# Patient Record
Sex: Female | Born: 1960
Health system: Southern US, Community
[De-identification: ages and names within clinical notes are randomized; demographics above are authoritative.]

## PROBLEM LIST (undated history)

## (undated) DIAGNOSIS — F32A Depression, unspecified: Secondary | ICD-10-CM

## (undated) DIAGNOSIS — G4730 Sleep apnea, unspecified: Secondary | ICD-10-CM

## (undated) DIAGNOSIS — F329 Major depressive disorder, single episode, unspecified: Secondary | ICD-10-CM

## (undated) DIAGNOSIS — C801 Malignant (primary) neoplasm, unspecified: Secondary | ICD-10-CM

## (undated) DIAGNOSIS — R519 Headache, unspecified: Secondary | ICD-10-CM

## (undated) DIAGNOSIS — D699 Hemorrhagic condition, unspecified: Secondary | ICD-10-CM

## (undated) DIAGNOSIS — T7840XA Allergy, unspecified, initial encounter: Secondary | ICD-10-CM

## (undated) DIAGNOSIS — E785 Hyperlipidemia, unspecified: Secondary | ICD-10-CM

## (undated) DIAGNOSIS — I499 Cardiac arrhythmia, unspecified: Secondary | ICD-10-CM

## (undated) DIAGNOSIS — R011 Cardiac murmur, unspecified: Secondary | ICD-10-CM

## (undated) DIAGNOSIS — M1990 Unspecified osteoarthritis, unspecified site: Secondary | ICD-10-CM

## (undated) DIAGNOSIS — M810 Age-related osteoporosis without current pathological fracture: Secondary | ICD-10-CM

## (undated) HISTORY — PX: JOINT REPLACEMENT: SHX530

## (undated) HISTORY — PX: COLONOSCOPY: SHX174

## (undated) HISTORY — DX: Age-related osteoporosis without current pathological fracture: M81.0

## (undated) HISTORY — DX: Unspecified osteoarthritis, unspecified site: M19.90

## (undated) HISTORY — DX: Cardiac murmur, unspecified: R01.1

## (undated) HISTORY — DX: Depression, unspecified: F32.A

## (undated) HISTORY — DX: Allergy, unspecified, initial encounter: T78.40XA

## (undated) HISTORY — DX: Sleep apnea, unspecified: G47.30

---
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
## 2006-02-13 HISTORY — PX: NASAL SINUS SURGERY: SHX719

## 2010-02-13 HISTORY — PX: BREAST LUMPECTOMY: SHX2

## 2010-09-14 DIAGNOSIS — C801 Malignant (primary) neoplasm, unspecified: Secondary | ICD-10-CM

## 2010-09-14 HISTORY — DX: Malignant (primary) neoplasm, unspecified: C80.1

## 2017-11-26 LAB — HM DEXA SCAN

## 2018-06-24 LAB — HM PAP SMEAR: HM Pap smear: NORMAL

## 2018-06-24 LAB — RESULTS CONSOLE HPV: CHL HPV: NEGATIVE

## 2018-07-01 ENCOUNTER — Encounter: Payer: Self-pay | Admitting: Physician Assistant

## 2018-07-01 LAB — HM MAMMOGRAPHY

## 2018-07-17 LAB — BASIC METABOLIC PANEL
BUN: 12 (ref 4–21)
Creatinine: 0.7 (ref 0.5–1.1)
Glucose: 89
Potassium: 4.1 (ref 3.4–5.3)
Sodium: 142 (ref 137–147)

## 2018-07-17 LAB — TSH: TSH: 2 (ref 0.41–5.90)

## 2018-07-17 LAB — CBC AND DIFFERENTIAL
HCT: 39 (ref 36–46)
Hemoglobin: 12.6 (ref 12.0–16.0)
Platelets: 209 (ref 150–399)
WBC: 6.1

## 2018-07-17 LAB — LIPID PANEL
Cholesterol: 187 (ref 0–200)
HDL: 69 (ref 35–70)
LDL Cholesterol: 98
Triglycerides: 102 (ref 40–160)

## 2018-07-17 LAB — HEPATIC FUNCTION PANEL
ALT: 28 (ref 7–35)
AST: 24 (ref 13–35)

## 2018-10-23 ENCOUNTER — Ambulatory Visit (INDEPENDENT_AMBULATORY_CARE_PROVIDER_SITE_OTHER): Payer: BC Managed Care – PPO | Admitting: Physician Assistant

## 2018-10-23 ENCOUNTER — Encounter: Payer: Self-pay | Admitting: Physician Assistant

## 2018-10-23 ENCOUNTER — Other Ambulatory Visit: Payer: Self-pay

## 2018-10-23 VITALS — BP 102/70 | HR 103 | Temp 96.9°F | Resp 16 | Ht 62.0 in | Wt 143.0 lb

## 2018-10-23 DIAGNOSIS — G4700 Insomnia, unspecified: Secondary | ICD-10-CM | POA: Diagnosis not present

## 2018-10-23 DIAGNOSIS — Z23 Encounter for immunization: Secondary | ICD-10-CM | POA: Diagnosis not present

## 2018-10-23 DIAGNOSIS — M810 Age-related osteoporosis without current pathological fracture: Secondary | ICD-10-CM | POA: Diagnosis not present

## 2018-10-23 DIAGNOSIS — Z9109 Other allergy status, other than to drugs and biological substances: Secondary | ICD-10-CM

## 2018-10-23 DIAGNOSIS — F329 Major depressive disorder, single episode, unspecified: Secondary | ICD-10-CM

## 2018-10-23 DIAGNOSIS — E785 Hyperlipidemia, unspecified: Secondary | ICD-10-CM | POA: Diagnosis not present

## 2018-10-23 DIAGNOSIS — F419 Anxiety disorder, unspecified: Secondary | ICD-10-CM

## 2018-10-23 DIAGNOSIS — C50911 Malignant neoplasm of unspecified site of right female breast: Secondary | ICD-10-CM

## 2018-10-23 DIAGNOSIS — Z124 Encounter for screening for malignant neoplasm of cervix: Secondary | ICD-10-CM

## 2018-10-23 DIAGNOSIS — F32A Depression, unspecified: Secondary | ICD-10-CM

## 2018-10-23 NOTE — Progress Notes (Signed)
Patient: Patricia Kemp Female    DOB: [birth year]   58 y.o.   MRN: [id number] Visit Date: 10/24/2018  Today's Provider: Trinna Post, PA-C   Chief Complaint  Patient presents with  . Establish Care   Subjective:     HPI   Patient is here to establish care. She has moved here from [place]. 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 Her father was a [place] professor but she did not learn [place] as a child. She lived there for extended periods of time as a teenager and then again during her junior year of college. 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  Hyperlipidemia: Lipitor 10 mg daily  Lipid Panel     Component Value Date/Time   CHOL 187 07/17/2018   TRIG 102 07/17/2018   HDL 69 07/17/2018   LDLCALC 98 07/17/2018    Depression: 30 mg cymbalta once daily  Allergies: sinus congestion, PND nasacort without relief.   Osteoporosis: fosamax 70 mg weekly.   Insomnia: Is cutting 10 mg tablets of ambien into quarters and taking this nightly. She is also using melatonin. She is trying to wean herself off of Ambien.    Tetanus: unsure when she had this last.  PAP: last PAP smear, this spring, Jun 24 2018 mammogram Colonoscopy: 2019 in [place], normal. Mammogram: [facility] [place] - June 2020   History of Breast Cancerr: She underwent lumpectomy and radiation 8 years ago, did not undergo chemotherapy. She would like to be referred to [facility] and oncologist.     Allergies  Allergen Reactions  . Amoxicillin Nausea Only     Current Outpatient Medications:  .  alendronate (FOSAMAX) 70 MG tablet, Take 70 mg by mouth once a week. , Disp: , Rfl:  .  atorvastatin (LIPITOR) 10 MG tablet, Take 10 mg by mouth daily. , Disp: , Rfl:  .  DULoxetine (CYMBALTA) 30 MG capsule, Take 30 mg by mouth daily. , Disp: , Rfl:  .  Triamcinolone Acetonide (NASACORT AQ NA), Place into the nose daily., Disp: , Rfl:  .  zolpidem (AMBIEN) 10 MG tablet, Take 10 mg by mouth once.  , Disp: , Rfl:   Review of Systems  HENT: Positive for congestion and sinus pressure.   All other systems reviewed and are negative.   Social History   Tobacco Use  . Smoking status: Never Smoker  . Smokeless tobacco: Never Used  Substance Use Topics  . Alcohol use: Yes    Frequency: Never      Objective:   BP 102/70 (BP Location: Left Arm, Patient Position: Sitting, Cuff Size: Large)   Pulse (!) 103   Temp (!) 96.9 F (36.1 C) (Other (Comment))   Resp 16   Ht 5\' 2"  (1.575 m)   Wt 143 lb (64.9 kg)   SpO2 96%   BMI 26.16 kg/m  Vitals:   10/23/18 1044  BP: 102/70  Pulse: (!) 103  Resp: 16  Temp: (!) 96.9 F (36.1 C)  TempSrc: Other (Comment)  SpO2: 96%  Weight: 143 lb (64.9 kg)  Height: 5\' 2"  (1.575 m)  Body mass index is 26.16 kg/m.   Physical Exam Constitutional:      Appearance: Normal appearance.  Cardiovascular:     Rate and Rhythm: Normal rate and regular rhythm.     Heart sounds: Normal heart sounds.  Pulmonary:     Effort: Pulmonary effort is normal.  Breath sounds: Normal breath sounds.  Abdominal:     General: Bowel sounds are normal.     Palpations: Abdomen is soft.  Skin:    General: Skin is warm and dry.  Neurological:     Mental Status: She is alert and oriented to person, place, and time. Mental status is at baseline.  Psychiatric:        Mood and Affect: Mood normal.        Behavior: Behavior normal.      No results found for any visits on 10/23/18.     Assessment & Plan    1. Osteoporosis, unspecified osteoporosis type, unspecified pathological fracture presence  - alendronate (FOSAMAX) 70 MG tablet; Take 1 tablet (70 mg total) by mouth once a week.  Dispense: 12 tablet; Refill: 1  2. Hyperlipidemia, unspecified hyperlipidemia type  - atorvastatin (LIPITOR) 10 MG tablet; Take 1 tablet (10 mg total) by mouth daily.  Dispense: 90 tablet; Refill: 1  3. Anxiety and depression  - DULoxetine (CYMBALTA) 30 MG capsule; Take 1  capsule (30 mg total) by mouth daily.  Dispense: 90 capsule; Refill: 1  4. Insomnia, unspecified type  Discussed weaning off Ambien slowly. Recommend continuing 2.5 mg dose nightly for another two months and then transition to every other night. Can consider starting trazadone if she needs help sleeping.   - zolpidem (AMBIEN) 5 MG tablet; Take 0.5 tablets (2.5 mg total) by mouth at bedtime as needed for sleep.  Dispense: 30 tablet; Refill: 0  5. Environmental allergies   6. Need for influenza vaccination  - Flu Vaccine QUAD 36+ mos IM  7. Malignant neoplasm of right female breast, unspecified estrogen receptor status, unspecified site of breast [facility])  - Ambulatory referral to Hematology / Oncology  8. Cervical cancer screening  - Ambulatory referral to Obstetrics / Gynecology  The entirety of the information documented in the History of Present Illness, Review of Systems and Physical Exam were personally obtained by me. Portions of this information were initially documented by April M. Sabra Heck, CMA and reviewed by me for thoroughness and accuracy.   F/u 9 months for CPE    Trinna Post, PA-C  [facility]

## 2018-10-23 NOTE — Patient Instructions (Addendum)
Xyzal > allegra > claritin > zyrtec (allergy medicines)   Allergic Rhinitis, Adult Allergic rhinitis is a reaction to allergens in the air. Allergens are tiny specks (particles) in the air that cause your body to have an allergic reaction. This condition cannot be passed from person to person (is not contagious). Allergic rhinitis cannot be cured, but it can be controlled. There are two types of allergic rhinitis:  Seasonal. This type is also called hay fever. It happens only during certain times of the year.  Perennial. This type can happen at any time of the year. What are the causes? This condition may be caused by:  Pollen from grasses, trees, and weeds.  House dust mites.  Pet dander.  Mold. What are the signs or symptoms? Symptoms of this condition include:  Sneezing.  Runny or stuffy nose (nasal congestion).  A lot of mucus in the back of the throat (postnasal drip).  Itchy nose.  Tearing of the eyes.  Trouble sleeping.  Being sleepy during day. How is this treated? There is no cure for this condition. You should avoid things that trigger your symptoms (allergens). Treatment can help to relieve symptoms. This may include:  Medicines that block allergy symptoms, such as antihistamines. These may be given as a shot, nasal spray, or pill.  Shots that are given until your body becomes less sensitive to the allergen (desensitization).  Stronger medicines, if all other treatments have not worked. Follow these instructions at home: Avoiding allergens   Find out what you are allergic to. Common allergens include smoke, dust, and pollen.  Avoid them if you can. These are some of the things that you can do to avoid allergens: ? Replace carpet with wood, tile, or vinyl flooring. Carpet can trap dander and dust. ? Clean any mold found in the home. ? Do not smoke. Do not allow smoking in your home. ? Change your heating and air conditioning filter at least once a month.  ? During allergy season:  Keep windows closed as much as you can. If possible, use air conditioning when there is a lot of pollen in the air.  Use a special filter for allergies with your furnace and air conditioner.  Plan outdoor activities when pollen counts are lowest. This is usually during the early morning or evening hours.  If you do go outdoors when pollen count is high, wear a special mask for people with allergies.  When you come indoors, take a shower and change your clothes before sitting on furniture or bedding. General instructions  Do not use fans in your home.  Do not hang clothes outside to dry.  Wear sunglasses to keep pollen out of your eyes.  Wash your hands right away after you touch household pets.  Take over-the-counter and prescription medicines only as told by your doctor.  Keep all follow-up visits as told by your doctor. This is important. Contact a doctor if:  You have a fever.  You have a cough that does not go away (is persistent).  You start to make whistling sounds when you breathe (wheeze).  Your symptoms do not get better with treatment.  You have thick fluid coming from your nose.  You start to have nosebleeds. Get help right away if:  Your tongue or your lips are swollen.  You have trouble breathing.  You feel dizzy or you feel like you are going to pass out (faint).  You have cold sweats. Summary  Allergic rhinitis is a  reaction to allergens in the air.  This condition may be caused by allergens. These include pollen, dust mites, pet dander, and mold.  Symptoms include a runny, itchy nose, sneezing, or tearing eyes. You may also have trouble sleeping or feel sleepy during the day.  Treatment includes taking medicines and avoiding allergens. You may also get shots or take stronger medicines.  Get help if you have a fever or a cough that does not stop. Get help right away if you are short of breath. This information is not  intended to replace advice given to you by your health care provider. Make sure you discuss any questions you have with your health care provider. Document Released: 06/01/2010 Document Revised: 05/21/2018 Document Reviewed: 08/21/2017 Elsevier Patient Education  2020 [employer].

## 2018-10-24 ENCOUNTER — Encounter: Payer: Self-pay | Admitting: Physician Assistant

## 2018-10-24 DIAGNOSIS — F419 Anxiety disorder, unspecified: Secondary | ICD-10-CM | POA: Insufficient documentation

## 2018-10-24 DIAGNOSIS — Z9109 Other allergy status, other than to drugs and biological substances: Secondary | ICD-10-CM | POA: Insufficient documentation

## 2018-10-24 DIAGNOSIS — G4709 Other insomnia: Secondary | ICD-10-CM | POA: Insufficient documentation

## 2018-10-24 DIAGNOSIS — E785 Hyperlipidemia, unspecified: Secondary | ICD-10-CM | POA: Insufficient documentation

## 2018-10-24 DIAGNOSIS — G4700 Insomnia, unspecified: Secondary | ICD-10-CM | POA: Insufficient documentation

## 2018-10-24 DIAGNOSIS — M810 Age-related osteoporosis without current pathological fracture: Secondary | ICD-10-CM | POA: Insufficient documentation

## 2018-10-24 DIAGNOSIS — F32A Depression, unspecified: Secondary | ICD-10-CM | POA: Insufficient documentation

## 2018-10-24 DIAGNOSIS — C50911 Malignant neoplasm of unspecified site of right female breast: Secondary | ICD-10-CM | POA: Insufficient documentation

## 2018-10-24 DIAGNOSIS — M8580 Other specified disorders of bone density and structure, unspecified site: Secondary | ICD-10-CM | POA: Insufficient documentation

## 2018-10-24 DIAGNOSIS — F329 Major depressive disorder, single episode, unspecified: Secondary | ICD-10-CM | POA: Insufficient documentation

## 2018-10-24 MED ORDER — ALENDRONATE SODIUM 70 MG PO TABS: 70.0000 mg | ORAL_TABLET | ORAL | 1 refills | Status: DC

## 2018-10-24 MED ORDER — ZOLPIDEM TARTRATE 5 MG PO TABS: 2.5000 mg | ORAL_TABLET | Freq: Every evening | ORAL | 0 refills | Status: DC | PRN

## 2018-10-24 MED ORDER — DULOXETINE HCL 30 MG PO CPEP: 30.0000 mg | ORAL_CAPSULE | Freq: Every day | ORAL | 1 refills | Status: DC

## 2018-10-24 MED ORDER — ATORVASTATIN CALCIUM 10 MG PO TABS: 10.0000 mg | ORAL_TABLET | Freq: Every day | ORAL | 1 refills | Status: DC

## 2018-10-25 ENCOUNTER — Telehealth: Payer: Self-pay | Admitting: Obstetrics & Gynecology

## 2018-10-25 NOTE — Telephone Encounter (Signed)
[facility] referring for Cervical cancer screening. Called and left voicemail for patient to call back to be schedule

## 2018-10-28 NOTE — Telephone Encounter (Signed)
Called and left voice mail for patient to call back to be schedule °

## 2018-10-30 NOTE — Telephone Encounter (Signed)
Called and left voice mail for patient to call back to be schedule °

## 2018-11-13 ENCOUNTER — Encounter: Payer: Self-pay | Admitting: Physician Assistant

## 2018-11-15 ENCOUNTER — Other Ambulatory Visit: Payer: Self-pay

## 2018-11-18 ENCOUNTER — Encounter: Payer: Self-pay | Admitting: Oncology

## 2018-11-18 ENCOUNTER — Other Ambulatory Visit: Payer: Self-pay

## 2018-11-18 ENCOUNTER — Telehealth: Payer: Self-pay | Admitting: *Deleted

## 2018-11-18 ENCOUNTER — Inpatient Hospital Stay: Payer: BC Managed Care – PPO | Attending: Oncology | Admitting: Oncology

## 2018-11-18 VITALS — BP 109/70 | HR 83 | Temp 98.9°F | Ht 64.0 in | Wt 140.0 lb

## 2018-11-18 DIAGNOSIS — Z803 Family history of malignant neoplasm of breast: Secondary | ICD-10-CM | POA: Insufficient documentation

## 2018-11-18 DIAGNOSIS — G4700 Insomnia, unspecified: Secondary | ICD-10-CM | POA: Diagnosis not present

## 2018-11-18 DIAGNOSIS — E785 Hyperlipidemia, unspecified: Secondary | ICD-10-CM

## 2018-11-18 DIAGNOSIS — F329 Major depressive disorder, single episode, unspecified: Secondary | ICD-10-CM | POA: Diagnosis not present

## 2018-11-18 DIAGNOSIS — Z79899 Other long term (current) drug therapy: Secondary | ICD-10-CM | POA: Insufficient documentation

## 2018-11-18 DIAGNOSIS — Z170 Estrogen receptor positive status [ER+]: Secondary | ICD-10-CM | POA: Diagnosis not present

## 2018-11-18 DIAGNOSIS — Z08 Encounter for follow-up examination after completed treatment for malignant neoplasm: Secondary | ICD-10-CM

## 2018-11-18 DIAGNOSIS — Z808 Family history of malignant neoplasm of other organs or systems: Secondary | ICD-10-CM | POA: Insufficient documentation

## 2018-11-18 DIAGNOSIS — Z853 Personal history of malignant neoplasm of breast: Secondary | ICD-10-CM | POA: Diagnosis not present

## 2018-11-18 DIAGNOSIS — Z880 Allergy status to penicillin: Secondary | ICD-10-CM | POA: Diagnosis not present

## 2018-11-18 DIAGNOSIS — F419 Anxiety disorder, unspecified: Secondary | ICD-10-CM | POA: Insufficient documentation

## 2018-11-18 DIAGNOSIS — Z809 Family history of malignant neoplasm, unspecified: Secondary | ICD-10-CM | POA: Insufficient documentation

## 2018-11-18 DIAGNOSIS — M1990 Unspecified osteoarthritis, unspecified site: Secondary | ICD-10-CM | POA: Diagnosis not present

## 2018-11-18 NOTE — Telephone Encounter (Signed)
Patient called stating that her previous Dr Helmut Muster in [place] needs the signed release form our office to send records faxed to  [phone]

## 2018-11-18 NOTE — Progress Notes (Signed)
Patient is here today to establish care for her history of right breast cancer. Patient had a lumpectomy in 09/2010.

## 2018-11-18 NOTE — Telephone Encounter (Signed)
I have faxed the consent request to the office that pt states will give [place]  Fax # [phone]

## 2018-11-18 NOTE — Progress Notes (Signed)
Hematology/Oncology Consult note [facility] [phone] [phone]  Patient Care Team: Paulene Floor as PCP - General (Physician Assistant)   Name of the patient: Patricia Kemp  [id number]  12/12/1960    Reason for referral-history of breast cancer   Referring physician-Adriana Lucas Mallow and PA  Date of visit: 11/18/18   History of presenting illness-patient is a 58 year old female with a past medical history significant for hyperlipidemia and osteoporosis.  She had stage I right breast cancer back in 2012 which was treated in [place].  She reports receiving adjuvant radiation treatment and completed 5 years of hormone therapy with Arimidex.  She did not require adjuvant chemotherapy.  I do not have any records of her breast cancer diagnosis at this time.  Her mother died of breast cancer.  For her osteoporosis she is currently on Fosamax.  Overall patient feels well and has moved to [place] from [place] and will be living here for blood.  She is active and reports no complaints today.  Energy levels are good and she denies any lumps or bumps anywhere.  Denies any changes in her appetite or unintentional weight loss or aches or pains anywhere.  She reportedly had her last mammogram in June 2020 before she moved to [place] which was unremarkable  ECOG PS- 0  Pain scale- 0   Review of systems- Review of Systems  Constitutional: Negative for chills, fever, malaise/fatigue and weight loss.  HENT: Negative for congestion, ear discharge and nosebleeds.   Eyes: Negative for blurred vision.  Respiratory: Negative for cough, hemoptysis, sputum production, shortness of breath and wheezing.   Cardiovascular: Negative for chest pain, palpitations, orthopnea and claudication.  Gastrointestinal: Negative for abdominal pain, blood in stool, constipation, diarrhea, heartburn, melena, nausea and vomiting.  Genitourinary: Negative for  dysuria, flank pain, frequency, hematuria and urgency.  Musculoskeletal: Negative for back pain, joint pain and myalgias.  Skin: Negative for rash.  Neurological: Negative for dizziness, tingling, focal weakness, seizures, weakness and headaches.  Endo/Heme/Allergies: Does not bruise/bleed easily.  Psychiatric/Behavioral: Negative for depression and suicidal ideas. The patient does not have insomnia.     Allergies  Allergen Reactions  . Amoxicillin Nausea Only    Patient Active Problem List   Diagnosis Date Noted  . Osteoporosis 10/24/2018  . Insomnia 10/24/2018  . Environmental allergies 10/24/2018  . Malignant neoplasm of right female breast ([place]) 10/24/2018  . Anxiety and depression 10/24/2018  . Hyperlipidemia 10/24/2018     Past Medical History:  Diagnosis Date  . Allergy   . Arthritis   . Cancer [facility]) 09/2010   Right breast cancer  . Depression   . Heart murmur   . Osteoporosis   . Sleep apnea      Past Surgical History:  Procedure Laterality Date  . BREAST SURGERY Right 09/2010  . CESAREAN SECTION    . JOINT REPLACEMENT    . NASAL SINUS SURGERY  2008    Social History   Socioeconomic History  . Marital status: Married    Spouse name: Not on file  . Number of children: Not on file  . Years of education: Not on file  . Highest education level: Not on file  Occupational History  . Not on file  Social Needs  . Financial resource strain: Not on file  . Food insecurity    Worry: Not on file    Inability: Not on file  . Transportation needs    Medical: Not  on file    Non-medical: Not on file  Tobacco Use  . Smoking status: Never Smoker  . Smokeless tobacco: Never Used  Substance and Sexual Activity  . Alcohol use: Yes    Alcohol/week: 1.0 standard drinks    Types: 1 Cans of beer per week    Frequency: Never    Comment: rare  . Drug use: Never  . Sexual activity: Not on file  Lifestyle  . Physical activity    Days per week: Not on file     Minutes per session: Not on file  . Stress: Not on file  Relationships  . Social Herbalist on phone: Not on file    Gets together: Not on file    Attends religious service: Not on file    Active member of club or organization: Not on file    Attends meetings of clubs or organizations: Not on file    Relationship status: Not on file  . Intimate partner violence    Fear of current or ex partner: Not on file    Emotionally abused: Not on file    Physically abused: Not on file    Forced sexual activity: Not on file  Other Topics Concern  . Not on file  Social History Narrative  . Not on file     Family History  Problem Relation Age of Onset  . Cancer Mother        Breast and lung  . Cancer Paternal Uncle   . Cancer Paternal Grandfather        bone  . Skin cancer Father        forehead     Current Outpatient Medications:  .  alendronate (FOSAMAX) 70 MG tablet, Take 1 tablet (70 mg total) by mouth once a week., Disp: 12 tablet, Rfl: 1 .  atorvastatin (LIPITOR) 10 MG tablet, Take 1 tablet (10 mg total) by mouth daily., Disp: 90 tablet, Rfl: 1 .  Calcium Citrate-Vitamin D (CALCIUM CITRATE + D PO), Take 1 tablet by mouth 1 day or 1 dose., Disp: , Rfl:  .  Cholecalciferol (VITAMIN D3 PO), Take 1 tablet by mouth 1 day or 1 dose., Disp: , Rfl:  .  DULoxetine (CYMBALTA) 30 MG capsule, Take 1 capsule (30 mg total) by mouth daily., Disp: 90 capsule, Rfl: 1 .  Multiple Vitamins-Minerals (WOMENS 50+ MULTI VITAMIN/MIN PO), Take 1 tablet by mouth 1 day or 1 dose., Disp: , Rfl:  .  Triamcinolone Acetonide (NASACORT AQ NA), Place into the nose daily., Disp: , Rfl:  .  Zinc 50 MG TABS, Take 1 tablet by mouth 1 day or 1 dose., Disp: , Rfl:  .  zolpidem (AMBIEN) 5 MG tablet, Take 0.5 tablets (2.5 mg total) by mouth at bedtime as needed for sleep., Disp: 30 tablet, Rfl: 0   Physical exam:  Vitals:   11/18/18 1105  BP: 109/70  Pulse: 83  Temp: 98.9 F (37.2 C)  TempSrc: Tympanic   Weight: 140 lb (63.5 kg)  Height: 5\' 4"  (1.626 m)   Physical Exam Constitutional:      General: She is not in acute distress. HENT:     Head: Normocephalic and atraumatic.  Eyes:     Pupils: Pupils are equal, round, and reactive to light.  Neck:     Musculoskeletal: Normal range of motion.  Cardiovascular:     Rate and Rhythm: Normal rate and regular rhythm.     Heart sounds: Normal heart  sounds.  Pulmonary:     Effort: Pulmonary effort is normal.     Breath sounds: Normal breath sounds.  Abdominal:     General: Bowel sounds are normal.     Palpations: Abdomen is soft.  Skin:    General: Skin is warm and dry.  Neurological:     Mental Status: She is alert and oriented to person, place, and time.    Breast exam was performed in seated and lying down position. Patient is status post right lumpectomy with a well-healed surgical scar. No evidence of any palpable masses. No evidence of axillary adenopathy. No evidence of any palpable masses or lumps in the left breast. No evidence of leftt axillary adenopathy    CMP Latest Ref Rng & Units 07/17/2018  BUN 4 - 21 12  Creatinine 0.5 - 1.1 0.7  Sodium 137 - 147 142  Potassium 3.4 - 5.3 4.1  AST 13 - 35 24  ALT 7 - 35 28   CBC Latest Ref Rng & Units 07/17/2018  WBC - 6.1  Hemoglobin 12.0 - 16.0 12.6  Hematocrit 36 - 46 39  Platelets 150 - 399 209    Assessment and plan- Patient is a 58 y.o. female with history of stage I ER positive right breast cancer in 2012-year to establish oncology care  It has been greater than 5 years since her breast cancer diagnosis and patient is status post lumpectomy adjuvant radiation and 5 years of hormone therapy.  Moving forward she will only need yearly breast exams and yearly mammograms.  I am unable to order a mammogram for June 2021 unless I have images from her prior mammogram.  Clinically she seems to be doing well and no concerning signs and symptoms of recurrence on today's exam.  No role  for routine labs and tumor marker testing for stage I breast cancer.  I did review her labs from June 2020 which were unremarkable.  We will obtain her prior records for comparison and schedule her mammogram for next year.  I will see her back in 1 years time   Thank you for this kind referral and the opportunity to participate in the care of this patient   Visit Diagnosis 1. Encounter for follow-up surveillance of breast cancer     Dr. Randa Evens, MD, MPH [facility] [id number] 11/18/2018 11:59 AM

## 2019-01-21 ENCOUNTER — Other Ambulatory Visit: Payer: Self-pay | Admitting: *Deleted

## 2019-01-21 DIAGNOSIS — Z08 Encounter for follow-up examination after completed treatment for malignant neoplasm: Secondary | ICD-10-CM

## 2019-01-29 ENCOUNTER — Encounter: Payer: Self-pay | Admitting: Physician Assistant

## 2019-01-29 NOTE — Progress Notes (Signed)
Patient: Patricia Kemp Female    DOB: April 18, 1960   58 y.o.   MRN: [id number] Visit Date: 01/29/2019  Today's Provider: Trinna Post, PA-C   Chief Complaint  Patient presents with  . Sinus Problem   Subjective:    Virtual Visit via Video Note  I connected with Patricia Kemp on 01/29/19 at 10:40 AM EST by a video enabled telemedicine application and verified that I am speaking with the correct person using two identifiers.  Location: Patient: Home Provider: Office  Sinus Problem This is a new problem. Episode onset: 1 week ago. The problem has been gradually worsening since onset. There has been no fever. Associated symptoms include congestion, coughing, ear pain, headaches, sinus pressure and a sore throat. Pertinent negatives include no chills, diaphoresis, neck pain, shortness of breath or sneezing. (Mucus production) Treatments tried: nasal rinse netti pot, nasal decongestants. The treatment provided no relief.   Denies fevers, chills, SOB, nausea, vomiting, diarrhea. Denies contact with COVID + or suspected positive.   Allergies  Allergen Reactions  . Amoxicillin Nausea Only     Current Outpatient Medications:  .  alendronate (FOSAMAX) 70 MG tablet, Take 1 tablet (70 mg total) by mouth once a week., Disp: 12 tablet, Rfl: 1 .  atorvastatin (LIPITOR) 10 MG tablet, Take 1 tablet (10 mg total) by mouth daily., Disp: 90 tablet, Rfl: 1 .  Calcium Citrate-Vitamin D (CALCIUM CITRATE + D PO), Take 1 tablet by mouth 1 day or 1 dose., Disp: , Rfl:  .  Cholecalciferol (VITAMIN D3 PO), Take 1 tablet by mouth 1 day or 1 dose., Disp: , Rfl:  .  DULoxetine (CYMBALTA) 30 MG capsule, Take 1 capsule (30 mg total) by mouth daily., Disp: 90 capsule, Rfl: 1 .  Multiple Vitamins-Minerals (WOMENS 50+ MULTI VITAMIN/MIN PO), Take 1 tablet by mouth 1 day or 1 dose., Disp: , Rfl:  .  Triamcinolone Acetonide (NASACORT AQ NA), Place into the nose daily., Disp: , Rfl:  .  Zinc 50 MG TABS, Take 1  tablet by mouth 1 day or 1 dose., Disp: , Rfl:  .  zolpidem (AMBIEN) 5 MG tablet, Take 0.5 tablets (2.5 mg total) by mouth at bedtime as needed for sleep., Disp: 30 tablet, Rfl: 0  Review of Systems  Constitutional: Negative for chills and diaphoresis.  HENT: Positive for congestion, ear pain, sinus pressure, sore throat and tinnitus (both ears). Negative for sneezing.   Respiratory: Positive for cough. Negative for shortness of breath.   Musculoskeletal: Negative for neck pain.  Neurological: Positive for headaches.    Social History   Tobacco Use  . Smoking status: Never Smoker  . Smokeless tobacco: Never Used  Substance Use Topics  . Alcohol use: Yes    Alcohol/week: 1.0 standard drinks    Types: 1 Cans of beer per week    Comment: rare      Objective:   There were no vitals taken for this visit. There were no vitals filed for this visit.There is no height or weight on file to calculate BMI.   Physical Exam Constitutional:      Appearance: Normal appearance. She is ill-appearing.  Pulmonary:     Effort: Pulmonary effort is normal. No respiratory distress.  Neurological:     Mental Status: She is alert.  Psychiatric:        Mood and Affect: Mood normal.        Behavior: Behavior normal.      No  results found for any visits on 01/30/19.     Assessment & Plan    1. Acute non-recurrent frontal sinusitis  - doxycycline (VIBRA-TABS) 100 MG tablet; Take 1 tablet (100 mg total) by mouth 2 (two) times daily for 7 days.  Dispense: 14 tablet; Refill: 0   I discussed the assessment and treatment plan with the patient. The patient was provided an opportunity to ask questions and all were answered. The patient agreed with the plan and demonstrated an understanding of the instructions.   The patient was advised to call back or seek an in-person evaluation if the symptoms worsen or if the condition fails to improve as anticipated.  I provided 15 minutes of non-face-to-face  time during this encounter.  The entirety of the information documented in the History of Present Illness, Review of Systems and Physical Exam were personally obtained by me. Portions of this information were initially documented by Meyer Cory, CMA and reviewed by me for thoroughness and accuracy.      Trinna Post, PA-C  [facility]

## 2019-01-30 ENCOUNTER — Ambulatory Visit (INDEPENDENT_AMBULATORY_CARE_PROVIDER_SITE_OTHER): Payer: BC Managed Care – PPO | Admitting: Physician Assistant

## 2019-01-30 DIAGNOSIS — J0110 Acute frontal sinusitis, unspecified: Secondary | ICD-10-CM | POA: Diagnosis not present

## 2019-01-30 MED ORDER — DOXYCYCLINE HYCLATE 100 MG PO TABS: 100.0000 mg | ORAL_TABLET | Freq: Two times a day (BID) | ORAL | 0 refills | Status: DC

## 2019-01-30 NOTE — Patient Instructions (Signed)

## 2019-02-04 ENCOUNTER — Ambulatory Visit (INDEPENDENT_AMBULATORY_CARE_PROVIDER_SITE_OTHER): Payer: BC Managed Care – PPO | Admitting: Physician Assistant

## 2019-02-04 ENCOUNTER — Encounter: Payer: Self-pay | Admitting: Physician Assistant

## 2019-02-04 DIAGNOSIS — J0110 Acute frontal sinusitis, unspecified: Secondary | ICD-10-CM

## 2019-02-04 MED ORDER — PREDNISONE 10 MG (21) PO TBPK: ORAL_TABLET | ORAL | 0 refills | Status: DC

## 2019-02-04 MED ORDER — DOXYCYCLINE HYCLATE 100 MG PO TABS: 100.0000 mg | ORAL_TABLET | Freq: Two times a day (BID) | ORAL | 0 refills | Status: AC

## 2019-02-04 NOTE — Progress Notes (Signed)
Patient: Patricia Kemp Female    DOB: [birth year]   58 y.o.   MRN: [id number] Visit Date: 02/04/2019  Today's Provider: Trinna Post, PA-C   No chief complaint on file.  Subjective:    Virtual Visit via Video Note  I connected with Patricia Kemp on 02/04/19 at  2:40 PM EST by a video enabled telemedicine application and verified that I am speaking with the correct person using two identifiers.  Location: Patient: Home Provider: Office   I discussed the limitations of evaluation and management by telemedicine and the availability of in person appointments. The patient expressed understanding and agreed to proceed.   HPI   Patient presents today with sinus congestion. She was treated for sinusitis by this office on 01/30/2019 with doxycycline 100 mg BID x 7 days. Patient reports some improvement but still endorses unilateral right sided sinus pain and she is on her last day of medicine. Denies fevers, chills, nausea, vomiting, COVID + contact.  Allergies  Allergen Reactions  . Amoxicillin Nausea Only     Current Outpatient Medications:  .  alendronate (FOSAMAX) 70 MG tablet, Take 1 tablet (70 mg total) by mouth once a week., Disp: 12 tablet, Rfl: 1 .  atorvastatin (LIPITOR) 10 MG tablet, Take 1 tablet (10 mg total) by mouth daily., Disp: 90 tablet, Rfl: 1 .  Calcium Citrate-Vitamin D (CALCIUM CITRATE + D PO), Take 1 tablet by mouth 1 day or 1 dose., Disp: , Rfl:  .  Cholecalciferol (VITAMIN D3 PO), Take 1 tablet by mouth 1 day or 1 dose., Disp: , Rfl:  .  doxycycline (VIBRA-TABS) 100 MG tablet, Take 1 tablet (100 mg total) by mouth 2 (two) times daily for 7 days., Disp: 14 tablet, Rfl: 0 .  DULoxetine (CYMBALTA) 30 MG capsule, Take 1 capsule (30 mg total) by mouth daily., Disp: 90 capsule, Rfl: 1 .  Multiple Vitamins-Minerals (WOMENS 50+ MULTI VITAMIN/MIN PO), Take 1 tablet by mouth 1 day or 1 dose., Disp: , Rfl:  .  Triamcinolone Acetonide (NASACORT AQ NA), Place into  the nose daily., Disp: , Rfl:  .  Zinc 50 MG TABS, Take 1 tablet by mouth 1 day or 1 dose., Disp: , Rfl:  .  zolpidem (AMBIEN) 5 MG tablet, Take 0.5 tablets (2.5 mg total) by mouth at bedtime as needed for sleep., Disp: 30 tablet, Rfl: 0  Review of Systems  Constitutional: Negative for appetite change, chills, fatigue and fever.  Respiratory: Negative for chest tightness and shortness of breath.   Cardiovascular: Negative for chest pain and palpitations.  Gastrointestinal: Negative for abdominal pain, nausea and vomiting.  Neurological: Negative for dizziness and weakness.    Social History   Tobacco Use  . Smoking status: Never Smoker  . Smokeless tobacco: Never Used  Substance Use Topics  . Alcohol use: Yes    Alcohol/week: 1.0 standard drinks    Types: 1 Cans of beer per week    Comment: rare      Objective:   There were no vitals taken for this visit. There were no vitals filed for this visit.There is no height or weight on file to calculate BMI.   Physical Exam Constitutional:      Appearance: Normal appearance.  Pulmonary:     Effort: Pulmonary effort is normal. No respiratory distress.  Neurological:     Mental Status: She is alert.  Psychiatric:        Mood and Affect: Mood normal.  Behavior: Behavior normal.      No results found for any visits on 02/04/19.     Assessment & Plan    1. Acute non-recurrent frontal sinusitis  Can extend doxycycline to 10 days and add prednisone or switch to Augmentin. Patient would like to extend doxycycline and take prednisone.   - doxycycline (VIBRA-TABS) 100 MG tablet; Take 1 tablet (100 mg total) by mouth 2 (two) times daily for 7 days.  Dispense: 14 tablet; Refill: 0 - predniSONE (STERAPRED UNI-PAK 21 TAB) 10 MG (21) TBPK tablet; Take 6 pills on day 1, take 5 pills on day 2 and so on until complete.  Dispense: 21 tablet; Refill: 0  I discussed the assessment and treatment plan with the patient. The patient was  provided an opportunity to ask questions and all were answered. The patient agreed with the plan and demonstrated an understanding of the instructions.   The patient was advised to call back or seek an in-person evaluation if the symptoms worsen or if the condition fails to improve as anticipated.  The entirety of the information documented in the History of Present Illness, Review of Systems and Physical Exam were personally obtained by me. Portions of this information were initially documented by April M. Sabra Heck, CMA and reviewed by me for thoroughness and accuracy.      Trinna Post, PA-C  [facility]

## 2019-02-05 NOTE — Patient Instructions (Signed)

## 2019-02-28 ENCOUNTER — Encounter: Payer: Self-pay | Admitting: Physician Assistant

## 2019-02-28 DIAGNOSIS — M810 Age-related osteoporosis without current pathological fracture: Secondary | ICD-10-CM

## 2019-03-04 MED ORDER — ALENDRONATE SODIUM 70 MG PO TABS: 70.0000 mg | ORAL_TABLET | ORAL | 1 refills | Status: DC

## 2019-04-29 ENCOUNTER — Other Ambulatory Visit: Payer: Self-pay | Admitting: Physician Assistant

## 2019-04-29 DIAGNOSIS — F32A Depression, unspecified: Secondary | ICD-10-CM

## 2019-04-29 DIAGNOSIS — F329 Major depressive disorder, single episode, unspecified: Secondary | ICD-10-CM

## 2019-04-29 NOTE — Telephone Encounter (Signed)
Requested Prescriptions  Pending Prescriptions Disp Refills  . DULoxetine (CYMBALTA) 30 MG capsule [Pharmacy Med Name: DULOXETINE HCL DR 30 MG CAP] 90 capsule 0    Sig: TAKE 1 CAPSULE BY MOUTH EVERY DAY     Psychiatry: Antidepressants - SNRI Failed - 04/29/2019  1:39 AM      Failed - Valid encounter within last 6 months    Recent Outpatient Visits          2 months ago Acute non-recurrent frontal sinusitis   [place], Adriana M, PA-C   2 months ago Acute non-recurrent frontal sinusitis   [facility] [place], Fabio Bering M, PA-C   6 months ago Osteoporosis, unspecified osteoporosis type, unspecified pathological fracture presence   [place], [place]             Passed - Completed PHQ-2 or PHQ-9 in the last 360 days.      Passed - Last BP in normal range    BP Readings from Last 1 Encounters:  11/18/18 109/70         patient had a conversation with MD about staying on this dose in January 2021.

## 2019-05-12 ENCOUNTER — Other Ambulatory Visit: Payer: Self-pay | Admitting: Physician Assistant

## 2019-05-12 DIAGNOSIS — E785 Hyperlipidemia, unspecified: Secondary | ICD-10-CM

## 2019-05-12 NOTE — Telephone Encounter (Signed)
Requested Prescriptions  Pending Prescriptions Disp Refills  . atorvastatin (LIPITOR) 10 MG tablet [Pharmacy Med Name: ATORVASTATIN 10 MG TABLET] 90 tablet 1    Sig: TAKE 1 TABLET BY MOUTH EVERY DAY     Cardiovascular:  Antilipid - Statins Passed - 05/12/2019  1:19 AM      Passed - Total Cholesterol in normal range and within 360 days    Cholesterol  Date Value Ref Range Status  07/17/2018 187 0 - 200 Final         Passed - LDL in normal range and within 360 days    LDL Cholesterol  Date Value Ref Range Status  07/17/2018 98  Final         Passed - HDL in normal range and within 360 days    HDL  Date Value Ref Range Status  07/17/2018 69 35 - 70 Final         Passed - Triglycerides in normal range and within 360 days    Triglycerides  Date Value Ref Range Status  07/17/2018 102 40 - 160 Final         Passed - Patient is not pregnant      Passed - Valid encounter within last 12 months    Recent Outpatient Visits          3 months ago Acute non-recurrent frontal sinusitis   [place], [place], PA-C   3 months ago Acute non-recurrent frontal sinusitis   [place], [place], PA-C   6 months ago Osteoporosis, unspecified osteoporosis type, unspecified pathological fracture presence   [facility] [place], [place], [place]

## 2019-06-25 ENCOUNTER — Ambulatory Visit (INDEPENDENT_AMBULATORY_CARE_PROVIDER_SITE_OTHER): Payer: BC Managed Care – PPO | Admitting: Obstetrics and Gynecology

## 2019-06-25 ENCOUNTER — Encounter: Payer: Self-pay | Admitting: Obstetrics and Gynecology

## 2019-06-25 ENCOUNTER — Other Ambulatory Visit: Payer: Self-pay

## 2019-06-25 VITALS — BP 104/80 | Ht 62.0 in | Wt 152.0 lb

## 2019-06-25 DIAGNOSIS — Z803 Family history of malignant neoplasm of breast: Secondary | ICD-10-CM | POA: Diagnosis not present

## 2019-06-25 DIAGNOSIS — C50911 Malignant neoplasm of unspecified site of right female breast: Secondary | ICD-10-CM | POA: Diagnosis not present

## 2019-06-25 DIAGNOSIS — Z1231 Encounter for screening mammogram for malignant neoplasm of breast: Secondary | ICD-10-CM | POA: Diagnosis not present

## 2019-06-25 DIAGNOSIS — Z01419 Encounter for gynecological examination (general) (routine) without abnormal findings: Secondary | ICD-10-CM | POA: Diagnosis not present

## 2019-06-25 DIAGNOSIS — M816 Localized osteoporosis [Lequesne]: Secondary | ICD-10-CM

## 2019-06-25 NOTE — Progress Notes (Signed)
PCP: Trinna Post, PA-C   Chief Complaint  Patient presents with  . Gynecologic Exam    HPI:      [name] is a 59 y.o. No obstetric history on file. whose LMP was No LMP recorded., presents today for her NP annual examination.  Her menses are absent due to menopause. Denies PMB. She has occas vasomotor sx.   Sex activity: not sexually active. She does have vaginal dryness.  Last Pap: 06/24/18  Results were: no abnormalities /neg HPV DNA.  Hx of STDs: none  Last mammogram: 07/01/18  Results were: normal--routine follow-up in 12 months There is a FH of breast cancer in her mom, genetic testing not indicated for pt.  There is no FH of ovarian cancer. The patient does not do self-breast exams. She has a hx of RT breast cancer age 52. Did radiation and treated with tamoxifen for 5 yrs. No longer doing tx. Seeing oncology yearly.   Colonoscopy: 2018, Repeat due after 5 years.  DEXA: 2019 with osteoporosis in spine and osteopenia in hip; treating with fosamax. DEXA due 10/21. Question being managed by PCP. Has appt this summer.   Tobacco use: The patient denies current or previous tobacco use. Alcohol use: social drinker Exercise: moderately active  She does get adequate calcium and Vitamin D in her diet.  Labs with PCP.   Past Medical History:  Diagnosis Date  . Allergy   . Arthritis   . Cancer [facility]) 09/2010   Right breast cancer  . Depression   . Heart murmur   . Osteoporosis    in spine  . Sleep apnea     Past Surgical History:  Procedure Laterality Date  . BREAST SURGERY Right 09/2010  . CESAREAN SECTION    . JOINT REPLACEMENT    . NASAL SINUS SURGERY  2008    Family History  Problem Relation Age of Onset  . Breast cancer Mother 39       Breast and lung  . Cancer Paternal Uncle   . Cancer Paternal Grandfather        bone  . Skin cancer Father        forehead    Social History   Socioeconomic History  . Marital status: Married    Spouse  name: Not on file  . Number of children: Not on file  . Years of education: Not on file  . Highest education level: Not on file  Occupational History  . Not on file  Tobacco Use  . Smoking status: Never Smoker  . Smokeless tobacco: Never Used  Substance and Sexual Activity  . Alcohol use: Yes    Alcohol/week: 1.0 standard drinks    Types: 1 Cans of beer per week    Comment: rare  . Drug use: Never  . Sexual activity: Yes  Other Topics Concern  . Not on file  Social History Narrative  . Not on file   Social Determinants of Health   Financial Resource Strain:   . Difficulty of Paying Living Expenses:   Food Insecurity:   . Worried About Charity fundraiser in the Last Year:   . Arboriculturist in the Last Year:   Transportation Needs:   . Film/video editor (Medical):   Marland Kitchen Lack of Transportation (Non-Medical):   Physical Activity:   . Days of Exercise per Week:   . Minutes of Exercise per Session:   Stress:   . Feeling of Stress :  Social Connections:   . Frequency of Communication with Friends and Family:   . Frequency of Social Gatherings with Friends and Family:   . Attends Religious Services:   . Active Member of Clubs or Organizations:   . Attends Archivist Meetings:   Marland Kitchen Marital Status:   Intimate Partner Violence:   . Fear of Current or Ex-Partner:   . Emotionally Abused:   Marland Kitchen Physically Abused:   . Sexually Abused:      Current Outpatient Medications:  .  alendronate (FOSAMAX) 70 MG tablet, Take 1 tablet (70 mg total) by mouth once a week., Disp: 12 tablet, Rfl: 1 .  atorvastatin (LIPITOR) 10 MG tablet, TAKE 1 TABLET BY MOUTH EVERY DAY, Disp: 90 tablet, Rfl: 1 .  Calcium Citrate-Vitamin D (CALCIUM CITRATE + D PO), Take 1 tablet by mouth 1 day or 1 dose., Disp: , Rfl:  .  Cholecalciferol (VITAMIN D3 PO), Take 1 tablet by mouth 1 day or 1 dose., Disp: , Rfl:  .  DULoxetine (CYMBALTA) 30 MG capsule, TAKE 1 CAPSULE BY MOUTH EVERY DAY, Disp: 90  capsule, Rfl: 0 .  Multiple Vitamins-Minerals (WOMENS 50+ MULTI VITAMIN/MIN PO), Take 1 tablet by mouth 1 day or 1 dose., Disp: , Rfl:  .  Triamcinolone Acetonide (NASACORT AQ NA), Place into the nose daily., Disp: , Rfl:  .  Zinc 50 MG TABS, Take 1 tablet by mouth 1 day or 1 dose., Disp: , Rfl:      ROS:  Review of Systems  Constitutional: Negative for fatigue, fever and unexpected weight change.  Respiratory: Negative for cough, shortness of breath and wheezing.   Cardiovascular: Negative for chest pain, palpitations and leg swelling.  Gastrointestinal: Negative for blood in stool, constipation, diarrhea, nausea and vomiting.  Endocrine: Negative for cold intolerance, heat intolerance and polyuria.  Genitourinary: Negative for dyspareunia, dysuria, flank pain, frequency, genital sores, hematuria, menstrual problem, pelvic pain, urgency, vaginal bleeding, vaginal discharge and vaginal pain.  Musculoskeletal: Positive for arthralgias. Negative for back pain, joint swelling and myalgias.  Skin: Negative for rash.  Neurological: Negative for dizziness, syncope, light-headedness, numbness and headaches.  Hematological: Negative for adenopathy.  Psychiatric/Behavioral: Negative for agitation, confusion, sleep disturbance and suicidal ideas. The patient is not nervous/anxious.    BREAST: No symptoms    Objective: BP 104/80   Ht 5\' 2"  (1.575 m)   Wt 152 lb (68.9 kg)   BMI 27.80 kg/m    Physical Exam Constitutional:      Appearance: She is well-developed.  Genitourinary:     Vulva, vagina, cervix, uterus, right adnexa and left adnexa normal.     No vulval lesion or tenderness noted.     No vaginal discharge, erythema or tenderness.     No cervical polyp.     Uterus is not enlarged or tender.     No right or left adnexal mass present.     Right adnexa not tender.     Left adnexa not tender.  Neck:     Thyroid: No thyromegaly.  Cardiovascular:     Rate and Rhythm: Normal rate  and regular rhythm.     Heart sounds: Normal heart sounds. No murmur.  Pulmonary:     Effort: Pulmonary effort is normal.     Breath sounds: Normal breath sounds.  Chest:     Breasts:        Right: No mass, nipple discharge, skin change or tenderness.        Left:  No mass, nipple discharge, skin change or tenderness.  Abdominal:     Palpations: Abdomen is soft.     Tenderness: There is no abdominal tenderness. There is no guarding.  Musculoskeletal:        General: Normal range of motion.     Cervical back: Normal range of motion.  Neurological:     General: No focal deficit present.     Mental Status: She is alert and oriented to person, place, and time.     Cranial Nerves: No cranial nerve deficit.  Skin:    General: Skin is warm and dry.  Psychiatric:        Mood and Affect: Mood normal.        Behavior: Behavior normal.        Thought Content: Thought content normal.        Judgment: Judgment normal.  Vitals reviewed.     Assessment/Plan:  Encounter for annual routine gynecological examination  Encounter for screening mammogram for malignant neoplasm of breast - Plan: MM 3D SCREEN BREAST BILATERAL; pt to sched mammo  Malignant neoplasm of right female breast, unspecified estrogen receptor status, unspecified site of breast ([place]); No current tx. Seeing oncology yearly.   Family history of breast cancer--doesn't qualify for cancer genetic testing due to ages of dx, but will cont to follow FH.  Localized osteoporosis without current pathological fracture--treating with fosamax, DEXA due 10/21. Pt to see if PCP will order/manage. If not, I can do DEXA order. Cont ca/Vit D          GYN counsel breast self exam, mammography screening, menopause, adequate intake of calcium and vitamin D, diet and exercise    F/U  Return in about 1 year (around 06/24/2020).  Merl Bommarito B. Marchel Foote, PA-C 06/25/2019 10:47 AM

## 2019-06-25 NOTE — Patient Instructions (Signed)
I value your feedback and entrusting us with your care. If you get a [facility] patient survey, I would appreciate you taking the time to let us know about your experience today. Thank you! ° °As of January 23, 2019, your lab results will be released to your MyChart immediately, before I even have a chance to see them. Please give me time to review them and contact you if there are any abnormalities. Thank you for your patience.  ° °[facility]: [phone] ° ° ° °

## 2019-06-26 DIAGNOSIS — M7052 Other bursitis of knee, left knee: Secondary | ICD-10-CM | POA: Diagnosis not present

## 2019-06-26 DIAGNOSIS — M25562 Pain in left knee: Secondary | ICD-10-CM | POA: Diagnosis not present

## 2019-07-15 DIAGNOSIS — M25562 Pain in left knee: Secondary | ICD-10-CM | POA: Diagnosis not present

## 2019-07-15 DIAGNOSIS — M25462 Effusion, left knee: Secondary | ICD-10-CM | POA: Diagnosis not present

## 2019-07-17 DIAGNOSIS — M25562 Pain in left knee: Secondary | ICD-10-CM | POA: Diagnosis not present

## 2019-07-17 DIAGNOSIS — M25462 Effusion, left knee: Secondary | ICD-10-CM | POA: Diagnosis not present

## 2019-07-21 DIAGNOSIS — M25462 Effusion, left knee: Secondary | ICD-10-CM | POA: Diagnosis not present

## 2019-07-21 DIAGNOSIS — M25562 Pain in left knee: Secondary | ICD-10-CM | POA: Diagnosis not present

## 2019-07-22 ENCOUNTER — Other Ambulatory Visit: Payer: Self-pay | Admitting: Physician Assistant

## 2019-07-22 DIAGNOSIS — F419 Anxiety disorder, unspecified: Secondary | ICD-10-CM

## 2019-07-22 NOTE — Telephone Encounter (Signed)
Requested Prescriptions  Pending Prescriptions Disp Refills   DULoxetine (CYMBALTA) 30 MG capsule [Pharmacy Med Name: DULOXETINE HCL DR 30 MG CAP] 90 capsule 0    Sig: TAKE 1 CAPSULE BY MOUTH EVERY DAY     Psychiatry: Antidepressants - SNRI Failed - 07/22/2019  1:30 AM      Failed - Valid encounter within last 6 months    Recent Outpatient Visits          5 months ago Acute non-recurrent frontal sinusitis   [place], [place], PA-C   5 months ago Acute non-recurrent frontal sinusitis   [place], [place], [place]   9 months ago Osteoporosis, unspecified osteoporosis type, unspecified pathological fracture presence   [facility] Trinna Post, [place]      Future Appointments            In 1 month Brendolyn Patty, MD [place] - Completed PHQ-2 or PHQ-9 in the last 360 days.      Passed - Last BP in normal range    BP Readings from Last 1 Encounters:  06/25/19 104/80         Valid encounter 5 months ago.  Courtesy Refill with request for patient to make an appointment for an office visit.

## 2019-07-24 DIAGNOSIS — M7050 Other bursitis of knee, unspecified knee: Secondary | ICD-10-CM | POA: Diagnosis not present

## 2019-07-29 DIAGNOSIS — M25562 Pain in left knee: Secondary | ICD-10-CM | POA: Diagnosis not present

## 2019-07-29 DIAGNOSIS — M25462 Effusion, left knee: Secondary | ICD-10-CM | POA: Diagnosis not present

## 2019-08-01 DIAGNOSIS — M25462 Effusion, left knee: Secondary | ICD-10-CM | POA: Diagnosis not present

## 2019-08-01 DIAGNOSIS — M25562 Pain in left knee: Secondary | ICD-10-CM | POA: Diagnosis not present

## 2019-08-04 DIAGNOSIS — M25462 Effusion, left knee: Secondary | ICD-10-CM | POA: Diagnosis not present

## 2019-08-04 DIAGNOSIS — M25562 Pain in left knee: Secondary | ICD-10-CM | POA: Diagnosis not present

## 2019-08-05 ENCOUNTER — Ambulatory Visit
Admission: RE | Admit: 2019-08-05 | Discharge: 2019-08-05 | Disposition: A | Discharge status: Home / Self Care | Payer: BC Managed Care – PPO | Source: Ambulatory Visit | Attending: Obstetrics and Gynecology | Admitting: Obstetrics and Gynecology

## 2019-08-05 ENCOUNTER — Encounter: Payer: Self-pay | Admitting: Obstetrics and Gynecology

## 2019-08-05 ENCOUNTER — Encounter: Payer: Self-pay | Admitting: Radiology

## 2019-08-05 DIAGNOSIS — Z1231 Encounter for screening mammogram for malignant neoplasm of breast: Secondary | ICD-10-CM | POA: Diagnosis not present

## 2019-08-07 DIAGNOSIS — M25462 Effusion, left knee: Secondary | ICD-10-CM | POA: Diagnosis not present

## 2019-08-07 DIAGNOSIS — M25562 Pain in left knee: Secondary | ICD-10-CM | POA: Diagnosis not present

## 2019-08-12 DIAGNOSIS — M25562 Pain in left knee: Secondary | ICD-10-CM | POA: Diagnosis not present

## 2019-08-12 DIAGNOSIS — M25462 Effusion, left knee: Secondary | ICD-10-CM | POA: Diagnosis not present

## 2019-08-13 NOTE — Progress Notes (Signed)
Complete physical exam   Patient: Patricia Kemp   DOB: 11/17/1960   59 y.o. Female  MRN: [id number] Visit Date: 08/14/2019  Today's healthcare provider: Trinna Post, PA-C   Chief Complaint  Patient presents with  . Annual Exam   Subjective    Patricia Kemp is a 59 y.o. female who presents today for a complete physical exam.  She reports consuming a general diet. The patient does not participate in regular exercise at present. She generally feels fairly well. She reports sleeping fairly well. She does have additional problems to discuss today.   She reports that she had her colonoscopy in [place]. She will bring the records in.  She has seen the oncologist last year due to breast cancer. Oncologist advised that she does not need to be followed up regularly. Patient reports that at her previous provider she got cancer labs every month, but she is unsure of what those labs were. She had a mammogram in 07/2019 and it was normal. She is due for her Dexa Scan. She is also due for a tetanus vaccine. She takes fosamax 70mg  once a week for osteoporosis. She was referred to gynecology last year where she continues to get annual exams.   Past Medical History:  Diagnosis Date  . Allergy   . Arthritis   . Cancer [facility]) 09/2010   Right breast cancer  . Depression   . Heart murmur   . Osteoporosis    in spine  . Sleep apnea    Past Surgical History:  Procedure Laterality Date  . BREAST LUMPECTOMY Right 2012   hx of breast ca rad only  . BREAST SURGERY Right 09/2010  . CESAREAN SECTION    . JOINT REPLACEMENT    . NASAL SINUS SURGERY  2008   Social History   Socioeconomic History  . Marital status: Married    Spouse name: Not on file  . Number of children: Not on file  . Years of education: Not on file  . Highest education level: Not on file  Occupational History  . Not on file  Tobacco Use  . Smoking status: Never Smoker  . Smokeless tobacco: Never Used  Vaping Use  .  Vaping Use: Never used  Substance and Sexual Activity  . Alcohol use: Yes    Alcohol/week: 1.0 standard drink    Types: 1 Cans of beer per week    Comment: rare  . Drug use: Never  . Sexual activity: Yes  Other Topics Concern  . Not on file  Social History Narrative  . Not on file   Social Determinants of Health   Financial Resource Strain:   . Difficulty of Paying Living Expenses:   Food Insecurity:   . Worried About Charity fundraiser in the Last Year:   . Arboriculturist in the Last Year:   Transportation Needs:   . Film/video editor (Medical):   Marland Kitchen Lack of Transportation (Non-Medical):   Physical Activity:   . Days of Exercise per Week:   . Minutes of Exercise per Session:   Stress:   . Feeling of Stress :   Social Connections:   . Frequency of Communication with Friends and Family:   . Frequency of Social Gatherings with Friends and Family:   . Attends Religious Services:   . Active Member of Clubs or Organizations:   . Attends Archivist Meetings:   Marland Kitchen Marital Status:   Intimate Partner Violence:   .  Fear of Current or Ex-Partner:   . Emotionally Abused:   Marland Kitchen Physically Abused:   . Sexually Abused:    Family Status  Relation Name Status  . Mother  Deceased  . Annamarie Major  Deceased  . PGF  Deceased  . Father  Alive   Family History  Problem Relation Age of Onset  . Breast cancer Mother 65       Breast and lung  . Cancer Paternal Uncle   . Cancer Paternal Grandfather        bone  . Skin cancer Father        forehead   Allergies  Allergen Reactions  . Amoxicillin Nausea Only    Patient Care Team: Paulene Floor as PCP - General (Physician Assistant)   Medications: Outpatient Medications Prior to Visit  Medication Sig  . atorvastatin (LIPITOR) 10 MG tablet TAKE 1 TABLET BY MOUTH EVERY DAY  . Calcium Citrate-Vitamin D (CALCIUM CITRATE + D PO) Take 1 tablet by mouth 1 day or 1 dose.  . Cholecalciferol (VITAMIN D3 PO) Take 1  tablet by mouth 1 day or 1 dose.  . DULoxetine (CYMBALTA) 30 MG capsule TAKE 1 CAPSULE BY MOUTH EVERY DAY  . Multiple Vitamins-Minerals (WOMENS 50+ MULTI VITAMIN/MIN PO) Take 1 tablet by mouth 1 day or 1 dose.  Marland Kitchen Zinc 50 MG TABS Take 1 tablet by mouth 1 day or 1 dose.  . [DISCONTINUED] alendronate (FOSAMAX) 70 MG tablet Take 1 tablet (70 mg total) by mouth once a week.  . Triamcinolone Acetonide (NASACORT AQ NA) Place into the nose daily. (Patient not taking: Reported on 08/14/2019)   No facility-administered medications prior to visit.    Review of Systems  Constitutional: Negative.   HENT: Negative.   Eyes: Negative.   Respiratory: Negative.   Cardiovascular: Negative.   Gastrointestinal: Negative.   Endocrine: Negative.   Genitourinary: Negative.   Musculoskeletal: Positive for arthralgias.  Skin: Negative.   Allergic/Immunologic: Negative.   Neurological: Negative.   Hematological: Negative.   Psychiatric/Behavioral: Negative.     {Heme  Chem  Endocrine  Serology  Results Review (optional):[id number]::" "}  Objective    BP 135/82 (BP Location: Left Arm, Patient Position: Sitting, Cuff Size: Normal)   Pulse 83   Temp 97.6 F (36.4 C) (Temporal)   Ht 5\' 2"  (1.575 m)   Wt 147 lb (66.7 kg)   BMI 26.89 kg/m    Physical Exam Constitutional:      Appearance: Normal appearance.  HENT:     Right Ear: Tympanic membrane normal.     Left Ear: Tympanic membrane normal.  Cardiovascular:     Rate and Rhythm: Normal rate and regular rhythm.     Pulses: Normal pulses.     Heart sounds: Normal heart sounds.  Pulmonary:     Effort: Pulmonary effort is normal.     Breath sounds: Normal breath sounds.  Abdominal:     General: Bowel sounds are normal.  Skin:    General: Skin is warm and dry.  Neurological:     Mental Status: She is alert and oriented to person, place, and time. Mental status is at baseline.  Psychiatric:        Mood and Affect: Mood normal.        Behavior:  Behavior normal.       Last depression screening scores PHQ 2/9 Scores 08/14/2019 10/23/2018  PHQ - 2 Score 0 1  PHQ- 9 Score - 1  Last fall risk screening Fall Risk  08/14/2019  Falls in the past year? 0  Number falls in past yr: 0  Injury with Fall? 0  Risk for fall due to : No Fall Risks  Follow up Falls evaluation completed   Last Audit-C alcohol use screening Alcohol Use Disorder Test (AUDIT) 08/14/2019  1. How often do you have a drink containing alcohol? 1  2. How many drinks containing alcohol do you have on a typical day when you are drinking? 0  3. How often do you have six or more drinks on one occasion? 0  AUDIT-C Score 1  Alcohol Brief Interventions/Follow-up AUDIT Score <7 follow-up not indicated   A score of 3 or more in women, and 4 or more in men indicates increased risk for alcohol abuse, EXCEPT if all of the points are from question 1   Results for orders placed or performed in visit on 08/14/19  CBC with Differential/Platelet  Result Value Ref Range   WBC 6.3 3.4 - 10.8 x10E3/uL   RBC 4.64 3.77 - 5.28 x10E6/uL   Hemoglobin 13.4 11.1 - 15.9 g/dL   Hematocrit 41.1 34.0 - 46.6 %   MCV 89 79 - 97 fL   MCH 28.9 26.6 - 33.0 pg   MCHC 32.6 31 - 35 g/dL   RDW 13.0 11.7 - 15.4 %   Platelets 207 150 - 450 x10E3/uL   Neutrophils 58 Not Estab. %   Lymphs 33 Not Estab. %   Monocytes 7 Not Estab. %   Eos 1 Not Estab. %   Basos 1 Not Estab. %   Neutrophils Absolute 3.7 1 - 7 x10E3/uL   Lymphocytes Absolute 2.1 0 - 3 x10E3/uL   Monocytes Absolute 0.5 0 - 0 x10E3/uL   EOS (ABSOLUTE) 0.1 0.0 - 0.4 x10E3/uL   Basophils Absolute 0.0 0 - 0 x10E3/uL   Immature Granulocytes 0 Not Estab. %   Immature Grans (Abs) 0.0 0.0 - 0.1 x10E3/uL  Comprehensive metabolic panel  Result Value Ref Range   Glucose 87 65 - 99 mg/dL   BUN 17 8 - 27 mg/dL   Creatinine, Ser 0.79 0.57 - 1.00 mg/dL   GFR calc non Af Amer 81 >59 mL/min/1.73   GFR calc Af Amer 93 >59 mL/min/1.73    BUN/Creatinine Ratio 22 12 - 28   Sodium 142 134 - 144 mmol/L   Potassium 3.9 3.5 - 5.2 mmol/L   Chloride 105 96 - 106 mmol/L   CO2 25 20 - 29 mmol/L   Calcium 9.3 8.7 - 10.3 mg/dL   Total Protein 6.4 6.0 - 8.5 g/dL   Albumin 4.5 3.8 - 4.8 g/dL   Globulin, Total 1.9 1.5 - 4.5 g/dL   Albumin/Globulin Ratio 2.4 (H) 1.2 - 2.2   Bilirubin Total 0.5 0.0 - 1.2 mg/dL   Alkaline Phosphatase 80 48 - 121 IU/L   AST 21 0 - 40 IU/L   ALT 16 0 - 32 IU/L  Lipid panel  Result Value Ref Range   Cholesterol, Total 180 100 - 199 mg/dL   Triglycerides 94 0 - 149 mg/dL   HDL 76 >39 mg/dL   VLDL Cholesterol Cal 17 5 - 40 mg/dL   LDL Chol Calc (NIH) 87 0 - 99 mg/dL   Chol/HDL Ratio 2.4 0.0 - 4.4 ratio  TSH  Result Value Ref Range   TSH 1.810 0.450 - 4.500 uIU/mL  HIV antibody (with reflex)  Result Value Ref Range   HIV Screen  4th Generation wRfx Non Reactive Non Reactive    Assessment & Plan    1. Annual physical exam  She will get [place] her colonoscopy records from her previous provider in [place].   - CBC with Differential/Platelet - Comprehensive metabolic panel - Lipid panel - TSH - HIV antibody (with reflex)  2. Localized osteoporosis without current pathological fracture  - DG BONE DENSITY (DXA); Future  3. Need for Td vaccine  - Td : Tetanus/diphtheria >7yo Preservative  free  4. Osteoporosis, unspecified osteoporosis type, unspecified pathological fracture presence  - alendronate (FOSAMAX) 70 MG tablet; Take 1 tablet (70 mg total) by mouth once a week.  Dispense: 12 tablet; Refill: 1  5. Hyperlipidemia, unspecified hyperlipidemia type   Routine Health Maintenance and Physical Exam  Exercise Activities and Dietary recommendations Goals   None     Immunization History  Administered Date(s) Administered  . Influenza,inj,Quad PF,6+ Mos 10/23/2018  . PFIZER SARS-COV-2 Vaccination 07/01/2019, 08/13/2019  . Td 08/14/2019    Health Maintenance  Topic Date Due  . Hepatitis C  Screening  Never done  . COLONOSCOPY  Never done  . INFLUENZA VACCINE  09/14/2019  . PAP SMEAR-Modifier  06/23/2021  . MAMMOGRAM  08/04/2021  . TETANUS/TDAP  08/13/2029  . COVID-19 Vaccine  Completed  . HIV Screening  Completed    Discussed health benefits of physical activity, and encouraged her to engage in regular exercise appropriate for her age and condition.    Return in about 6 months (around 02/14/2020) for HTN, HLD.     ITrinna Post, PA-C, have reviewed all documentation for this visit. The documentation on 08/15/19 for the exam, diagnosis, procedures, and orders are all accurate and complete.    Paulene Floor  [facility] [phone] (phone) [phone] (fax)  [place]

## 2019-08-14 ENCOUNTER — Ambulatory Visit (INDEPENDENT_AMBULATORY_CARE_PROVIDER_SITE_OTHER): Payer: BLUE CROSS/BLUE SHIELD | Admitting: Physician Assistant

## 2019-08-14 ENCOUNTER — Encounter: Payer: Self-pay | Admitting: Physician Assistant

## 2019-08-14 ENCOUNTER — Other Ambulatory Visit: Payer: Self-pay

## 2019-08-14 VITALS — BP 135/82 | HR 83 | Temp 97.6°F | Ht 62.0 in | Wt 147.0 lb

## 2019-08-14 DIAGNOSIS — Z0000 Encounter for general adult medical examination without abnormal findings: Secondary | ICD-10-CM

## 2019-08-14 DIAGNOSIS — E785 Hyperlipidemia, unspecified: Secondary | ICD-10-CM

## 2019-08-14 DIAGNOSIS — M816 Localized osteoporosis [Lequesne]: Secondary | ICD-10-CM

## 2019-08-14 DIAGNOSIS — Z23 Encounter for immunization: Secondary | ICD-10-CM | POA: Diagnosis not present

## 2019-08-14 DIAGNOSIS — M810 Age-related osteoporosis without current pathological fracture: Secondary | ICD-10-CM

## 2019-08-14 MED ORDER — ALENDRONATE SODIUM 70 MG PO TABS: 70.0000 mg | ORAL_TABLET | ORAL | 1 refills | Status: DC

## 2019-08-14 NOTE — Patient Instructions (Signed)
It is recommended that you have a shingles vaccine. Please call your insurance co and ask about Sh[employer] vaccine to see if this would be covered at the pharmacy or the clinic.

## 2019-08-15 LAB — COMPREHENSIVE METABOLIC PANEL
ALT: 16 IU/L (ref 0–32)
AST: 21 IU/L (ref 0–40)
Albumin/Globulin Ratio: 2.4 — ABNORMAL HIGH (ref 1.2–2.2)
Albumin: 4.5 g/dL (ref 3.8–4.8)
Alkaline Phosphatase: 80 IU/L (ref 48–121)
BUN/Creatinine Ratio: 22 (ref 12–28)
BUN: 17 mg/dL (ref 8–27)
Bilirubin Total: 0.5 mg/dL (ref 0.0–1.2)
CO2: 25 mmol/L (ref 20–29)
Calcium: 9.3 mg/dL (ref 8.7–10.3)
Chloride: 105 mmol/L (ref 96–106)
Creatinine, Ser: 0.79 mg/dL (ref 0.57–1.00)
GFR calc Af Amer: 93 mL/min/{1.73_m2} (ref >59)
GFR calc non Af Amer: 81 mL/min/{1.73_m2} (ref >59)
Globulin, Total: 1.9 g/dL (ref 1.5–4.5)
Glucose: 87 mg/dL (ref 65–99)
Potassium: 3.9 mmol/L (ref 3.5–5.2)
Sodium: 142 mmol/L (ref 134–144)
Total Protein: 6.4 g/dL (ref 6.0–8.5)

## 2019-08-15 LAB — CBC WITH DIFFERENTIAL/PLATELET
Basophils Absolute: 0 10*3/uL (ref 0.0–0.2)
Basos: 1 %
EOS (ABSOLUTE): 0.1 10*3/uL (ref 0.0–0.4)
Eos: 1 %
Hematocrit: 41.1 % (ref 34.0–46.6)
Hemoglobin: 13.4 g/dL (ref 11.1–15.9)
Immature Grans (Abs): 0 10*3/uL (ref 0.0–0.1)
Immature Granulocytes: 0 %
Lymphocytes Absolute: 2.1 10*3/uL (ref 0.7–3.1)
Lymphs: 33 %
MCH: 28.9 pg (ref 26.6–33.0)
MCHC: 32.6 g/dL (ref 31.5–35.7)
MCV: 89 fL (ref 79–97)
Monocytes Absolute: 0.5 10*3/uL (ref 0.1–0.9)
Monocytes: 7 %
Neutrophils Absolute: 3.7 10*3/uL (ref 1.4–7.0)
Neutrophils: 58 %
Platelets: 207 10*3/uL (ref 150–450)
RBC: 4.64 x10E6/uL (ref 3.77–5.28)
RDW: 13 % (ref 11.7–15.4)
WBC: 6.3 10*3/uL (ref 3.4–10.8)

## 2019-08-15 LAB — LIPID PANEL
Chol/HDL Ratio: 2.4 ratio (ref 0.0–4.4)
Cholesterol, Total: 180 mg/dL (ref 100–199)
HDL: 76 mg/dL (ref >39)
LDL Chol Calc (NIH): 87 mg/dL (ref 0–99)
Triglycerides: 94 mg/dL (ref 0–149)
VLDL Cholesterol Cal: 17 mg/dL (ref 5–40)

## 2019-08-15 LAB — TSH: TSH: 1.81 u[IU]/mL (ref 0.450–4.500)

## 2019-08-15 LAB — HIV ANTIBODY (ROUTINE TESTING W REFLEX): HIV Screen 4th Generation wRfx: NONREACTIVE

## 2019-08-19 ENCOUNTER — Ambulatory Visit: Payer: BLUE CROSS/BLUE SHIELD | Admitting: Dermatology

## 2019-08-19 ENCOUNTER — Other Ambulatory Visit: Payer: Self-pay

## 2019-08-19 ENCOUNTER — Other Ambulatory Visit: Payer: Self-pay | Admitting: Physician Assistant

## 2019-08-19 DIAGNOSIS — D225 Melanocytic nevi of trunk: Secondary | ICD-10-CM

## 2019-08-19 DIAGNOSIS — L821 Other seborrheic keratosis: Secondary | ICD-10-CM

## 2019-08-19 DIAGNOSIS — L918 Other hypertrophic disorders of the skin: Secondary | ICD-10-CM

## 2019-08-19 DIAGNOSIS — D229 Melanocytic nevi, unspecified: Secondary | ICD-10-CM | POA: Diagnosis not present

## 2019-08-19 DIAGNOSIS — D2272 Melanocytic nevi of left lower limb, including hip: Secondary | ICD-10-CM | POA: Diagnosis not present

## 2019-08-19 DIAGNOSIS — L814 Other melanin hyperpigmentation: Secondary | ICD-10-CM

## 2019-08-19 DIAGNOSIS — D239 Other benign neoplasm of skin, unspecified: Secondary | ICD-10-CM

## 2019-08-19 DIAGNOSIS — D1800 Hemangioma unspecified site: Secondary | ICD-10-CM

## 2019-08-19 DIAGNOSIS — Z1283 Encounter for screening for malignant neoplasm of skin: Secondary | ICD-10-CM

## 2019-08-19 DIAGNOSIS — F419 Anxiety disorder, unspecified: Secondary | ICD-10-CM

## 2019-08-19 DIAGNOSIS — D235 Other benign neoplasm of skin of trunk: Secondary | ICD-10-CM

## 2019-08-19 DIAGNOSIS — L578 Other skin changes due to chronic exposure to nonionizing radiation: Secondary | ICD-10-CM

## 2019-08-19 NOTE — Telephone Encounter (Signed)
Requested Prescriptions  Pending Prescriptions Disp Refills  . DULoxetine (CYMBALTA) 30 MG capsule [Pharmacy Med Name: DULOXETINE HCL DR 30 MG CAP] 90 capsule 1    Sig: TAKE 1 CAPSULE BY MOUTH EVERY DAY     Psychiatry: Antidepressants - SNRI Passed - 08/19/2019  1:33 PM      Passed - Completed PHQ-2 or PHQ-9 in the last 360 days.      Passed - Last BP in normal range    BP Readings from Last 1 Encounters:  08/14/19 135/82         Passed - Valid encounter within last 6 months    Recent Outpatient Visits          5 days ago Annual physical exam   [place], [place], [place]   6 months ago Acute non-recurrent frontal sinusitis   [place], [place], [place]   6 months ago Acute non-recurrent frontal sinusitis   [place], [place], [place]   10 months ago Osteoporosis, unspecified osteoporosis type, unspecified pathological fracture presence   [facility] Trinna Post, [place]      Future Appointments            In 6 months Brendolyn Patty, MD [place]   In 12 months Trinna Post, [place], [place]

## 2019-08-19 NOTE — Progress Notes (Signed)
   New Patient Visit  Subjective  Patricia Kemp is a 58 y.o. female who presents for the following: Annual Exam (Total Body Skin exam).  She has had some moles removed in the past.  No h/o skin cancer.  No changing or concerning lesions.   The following portions of the chart were reviewed this encounter and updated as appropriate:      Review of Systems:  No other skin or systemic complaints except as noted in HPI or Assessment and Plan.  Objective  Well appearing patient in no apparent distress; mood and affect are within normal limits.  A full examination was performed including scalp, head, eyes, ears, nose, lips, neck, chest, axillae, abdomen, back, buttocks, bilateral upper extremities, bilateral lower extremities, hands, feet, fingers, toes, fingernails, and toenails. All findings within normal limits unless otherwise noted below.  Objective    R mid sternum: 5.0 x 4.64mm med brown macule slight irregular pigment, (confocal evaluation from previous provider scanned in chart)  Left pretibia: 4.54mm med dark brown pap, (confocal evaluation from previous provider scanned in chart)  Left axilla: 4.19mm med dark brown pap  Left calf: 4.0 x 3.45mm med dark brown macule lighter superior  Central upper abdomen: 5.0 x 4.68mm med dark brown macule  Images            Objective  Right Thigh - Anterior, Right pretibia: Firm pink/brown papulenodule with dimple sign.   Objective  Right medial Breast: 1.2 x 0.8cm Stuck-on, waxy, tan-brown papule-- Discussed benign etiology and prognosis.    Assessment & Plan    Lentigines - Scattered tan macules - Discussed due to sun exposure - Benign, observe - Call for any changes  Seborrheic Keratoses - Stuck-on, waxy, tan-brown papules and plaques  - Discussed benign etiology and prognosis. - Observe - Call for any changes  Melanocytic Nevi - Tan-brown and/or pink-flesh-colored symmetric macules and papules - Benign  appearing on exam today - Observation - Call clinic for new or changing moles - Recommend daily use of broad spectrum spf 30+ sunscreen to sun-exposed areas.   Hemangiomas - Red papules - Discussed benign nature - Observe - Call for any changes  Actinic Damage - diffuse scaly erythematous macules with underlying dyspigmentation - Recommend daily broad spectrum sunscreen SPF 30+ to sun-exposed areas, reapply every 2 hours as needed.  - Call for new or changing lesions.  Skin cancer screening performed today.  Acrochordons (Skin Tags) - Fleshy, skin-colored pedunculated papules - Benign appearing.  - Observe. - If desired, they can be removed with an in office procedure that is not covered by insurance. - Please call the clinic if you notice any new or changing lesions.  Nevus (5) Left axilla; Left pretibia; Left calf; R mid sternum; Central upper abdomen  Benign Appearing, observe  Discussed bx for the R mid sternum, slightly irregular,  pt would prefer to observe  Dermatofibroma Right Thigh - Anterior, Right pretibia  Benign, Observe  Seborrheic keratosis Right medial Breast  Reassured benign age-related growth.  Recommend observation.  Discussed cryotherapy if spot(s) become irritated or inflamed.   Return in about 6 months (around 02/19/2020) for recheck moles.  I, Othelia Pulling, RMA, am acting as scribe for Brendolyn Patty, MD .  Documentation: I have reviewed the above documentation for accuracy and completeness, and I agree with the above.  Brendolyn Patty MD

## 2019-08-19 NOTE — Patient Instructions (Addendum)
Melanoma ABCDEs  Melanoma is the most dangerous type of skin cancer, and is the leading cause of death from skin disease.  You are more likely to develop melanoma if you:  Have light-colored skin, light-colored eyes, or red or blond hair  Spend a lot of time in the sun  Tan regularly, either outdoors or in a tanning bed  Have had blistering sunburns, especially during childhood  Have a close family member who has had a melanoma  Have atypical moles or large birthmarks  Early detection of melanoma is key since treatment is typically straightforward and cure rates are extremely high if we catch it early.   The first sign of melanoma is often a change in a mole or a new dark spot.  The ABCDE system is a way of remembering the signs of melanoma.  A for asymmetry:  The two halves do not match. B for border:  The edges of the growth are irregular. C for color:  A mixture of colors are present instead of an even brown color. D for diameter:  Melanomas are usually (but not always) greater than 6mm - the size of a pencil eraser. E for evolution:  The spot keeps changing in size, shape, and color.  Please check your skin once per month between visits. You can use a small mirror in front and a large mirror behind you to keep an eye on the back side or your body.   If you see any new or changing lesions before your next follow-up, please call to schedule a visit.  Please continue daily skin protection including broad spectrum sunscreen SPF 30+ to sun-exposed areas, reapplying every 2 hours as needed when you're outdoors.   Seborrheic Keratosis  What causes seborrheic keratoses? Seborrheic keratoses are harmless, common skin growths that first appear during adult life.  As time goes by, more growths appear.  Some people may develop a large number of them.  Seborrheic keratoses appear on both covered and uncovered body parts.  They are not caused by sunlight.  The tendency to develop seborrheic  keratoses can be inherited.  They vary in color from skin-colored to gray, brown, or even black.  They can be either smooth or have a rough, warty surface.   Seborrheic keratoses are superficial and look as if they were stuck on the skin.  Under the microscope this type of keratosis looks like layers upon layers of skin.  That is why at times the top layer may seem to fall off, but the rest of the growth remains and re-grows.    Treatment Seborrheic keratoses do not need to be treated, but can easily be removed in the office.  Seborrheic keratoses often cause symptoms when they rub on clothing or jewelry.  Lesions can be in the way of shaving.  If they become inflamed, they can cause itching, soreness, or burning.  Removal of a seborrheic keratosis can be accomplished by freezing, burning, or surgery. If any spot bleeds, scabs, or grows rapidly, please return to have it checked, as these can be an indication of a skin cancer.  

## 2019-09-04 DIAGNOSIS — M2392 Unspecified internal derangement of left knee: Secondary | ICD-10-CM | POA: Diagnosis not present

## 2019-09-04 DIAGNOSIS — M25562 Pain in left knee: Secondary | ICD-10-CM | POA: Diagnosis not present

## 2019-09-04 DIAGNOSIS — G8929 Other chronic pain: Secondary | ICD-10-CM | POA: Diagnosis not present

## 2019-09-15 ENCOUNTER — Other Ambulatory Visit: Payer: Self-pay | Admitting: Orthopedic Surgery

## 2019-09-15 ENCOUNTER — Ambulatory Visit: Payer: BC Managed Care – PPO | Admitting: Dermatology

## 2019-09-15 DIAGNOSIS — M25562 Pain in left knee: Secondary | ICD-10-CM

## 2019-09-15 DIAGNOSIS — G8929 Other chronic pain: Secondary | ICD-10-CM

## 2019-10-02 ENCOUNTER — Other Ambulatory Visit: Payer: Self-pay

## 2019-10-02 ENCOUNTER — Ambulatory Visit
Admission: RE | Admit: 2019-10-02 | Discharge: 2019-10-02 | Disposition: A | Discharge status: Home / Self Care | Payer: BC Managed Care – PPO | Source: Ambulatory Visit | Attending: Orthopedic Surgery | Admitting: Orthopedic Surgery

## 2019-10-02 DIAGNOSIS — M25562 Pain in left knee: Secondary | ICD-10-CM | POA: Diagnosis not present

## 2019-10-02 DIAGNOSIS — G8929 Other chronic pain: Secondary | ICD-10-CM | POA: Diagnosis not present

## 2019-10-09 DIAGNOSIS — M2392 Unspecified internal derangement of left knee: Secondary | ICD-10-CM | POA: Diagnosis not present

## 2019-10-20 NOTE — H&P (Signed)
ORTHOPAEDIC HISTORY & PHYSICAL Progress Notes Amit Meloy, Florinda Marker., MD - 10/09/2019 3:30 PM EDT Chief Complaint: Chief Complaint  Patient presents with  . Knee Pain  Left knee MRI 10/02/19   Reason for Visit: The patient is a 59 y.o. female who presents today for reevaluation of her left knee. She reports a 4 month(s) history of right knee pain. The onset of her symptoms was abrupt, although she does not recall any specific trauma or aggravating event. She localizes most of the pain along the medial aspect of the knee. She reports some swelling, no locking, and no giving way of the knee. The pain is aggravated by going up and down stairs and rising after sitting. The patient has not appreciated any significant improvement despite Tylenol, NSAIDs, ice, Voltaren gel, physical therapy, and corticosteroid injection to the pes anserine bursa. She actually reported an exacerbation of her pain with physical therapy. The pain has increased since she was last evaluated to the point that she is using a cane for ambulation.  Medications: Current Outpatient Medications  Medication Sig Dispense Refill  . alendronate (FOSAMAX) 70 MG tablet 70 mg every 7 (seven) days  . atorvastatin (LIPITOR) 10 MG tablet 10 mg once daily  . calcium citrate/vitamin D3 (CALCIUM CITRATE + D ORAL) Take 1 caplet by mouth once daily  . cholecalciferol, vitamin D3, (VITAMIN D3) 125 mcg (5,000 unit) tablet Take 5,000 Units by mouth once daily  . DULoxetine (CYMBALTA) 30 MG DR capsule 30 mg once daily  . loratadine (CLARITIN) 10 mg tablet Take 10 mg by mouth once daily  . multivitamin-Ca-iron-minerals (MULTIPLE VITAMIN, WOMENS) Tab Take 1 tablet by mouth once daily  . nabumetone (RELAFEN) 750 MG tablet 750 mg 2 (two) times daily as needed  . naproxen (NAPROSYN) 500 MG tablet Take 1 tablet (500 mg total) by mouth 2 (two) times daily with meals 30 tablet 0  . zinc sulfate 50 mg zinc (220 mg) Tab Take 1 tablet by mouth once daily    No current facility-administered medications for this visit.   Allergies: Allergies  Allergen Reactions  . Amoxicillin Nausea   Past Medical History: Past Medical History:  Diagnosis Date  . Breast cancer (CMS-HCC)  . Chicken pox   Past Surgical History: Past Surgical History:  Procedure Laterality Date  . CESAREAN SECTION  . MASTECTOMY PARTIAL / LUMPECTOMY 09/2010  . NASAL SURGERY  . Right total knee arthroplasty 2019  [place], [place]   Social History: Social History   Socioeconomic History  . Marital status: Married  Spouse name: Jeneen Rinks  . Number of children: 1  . Years of education: 50  . Highest education level: Master's degree (e.g., MA, MS, MEng, MEd, MSW, MBA)  Occupational History  . Occupation: Retired  Tobacco Use  . Smoking status: Never Smoker  . Smokeless tobacco: Never Used  Substance and Sexual Activity  . Alcohol use: Not Currently  Comment: rarely  . Drug use: Never  . Sexual activity: Yes  Partners: Male  Other Topics Concern  . Not on file  Social History Narrative  . Not on file   Social Determinants of Health   Financial Resource Strain:  . Difficulty of Paying Living Expenses:  Food Insecurity:  . Worried About Charity fundraiser in the Last Year:  . Arboriculturist in the Last Year:  Transportation Needs:  . Film/video editor (Medical):  Marland Kitchen Lack of Transportation (Non-Medical):  Physical Activity:  . Days of Exercise per  Week:  . Minutes of Exercise per Session:  Stress:  . Feeling of Stress :  Social Connections:  . Frequency of Communication with Friends and Family:  . Frequency of Social Gatherings with Friends and Family:  . Attends Religious Services:  . Active Member of Clubs or Organizations:  . Attends Archivist Meetings:  Marland Kitchen Marital Status:   Family History: Family History  Problem Relation Age of Onset  . Breast cancer Mother  . Skin cancer Father  . Diabetes type II Sister  . No Known Problems  Brother   Review of Systems: A comprehensive 14 point ROS was performed, reviewed, and the pertinent orthopaedic findings are documented in the HPI.  Exam BP 132/68  Temp 36.5 C (97.7 F)  Ht 157.5 cm (5\' 2" )  Wt 68.1 kg (150 lb 1.6 oz)  BMI 27.45 kg/m   General:  Well-developed, well-nourished female seen in no acute distress.  Antalgic gait.  No varus or valgus thrust to the left knee.  HEENT:  Atraumatic, normocephalic. Pupils are equal and reactive to light. Extraocular motion is intact. Sclera are clear. Oropharynx is clear with moist mucosa.  Lungs:  Clear to auscultation bilaterally.  Cardiovascular: Regular rate and rhythm. Normal S1, S2. No murmur . No appreciable gallops or rubs. Peripheral pulses are palpable. No lower extremity edema. Homan`s test is negative.   Extremities: Good strength, stability, and range of motion of the upper extremities. Good range of motion of the hips and ankles.  Left Knee:  Soft tissue swelling: mild Effusion: minimal Erythema: none Crepitance: none Tenderness: medial joint line and pes anserine tendons Alignment: normal Mediolateral laxity: stable Anterior drawer test:negative Lachman`s test: negative McMurray`s test: positive Atrophy: No significant atrophy.  Quadriceps tone was fair to good. Range of Motion: Greater than 120 degrees  Neurologic:  Awake, alert, and oriented.  Sensory function is intact to pinprick and light touch.  Motor strength is judged to be 5/5.  Motor coordination is within normal limits.  No apparent clonus. No tremor.   MRI: I reviewed the left knee MRI from [facility] dated 10/02/2019. I concur with the radiologist's interpretation as below:  MRI OF THE LEFT KNEE WITHOUT CONTRAST   TECHNIQUE:  Multiplanar, multisequence MR imaging of the knee was performed. No  intravenous contrast was administered.   COMPARISON: None.   FINDINGS:  MENISCI   Medial meniscus:  Horizontal tear in the posterior horn extends into  the body. The junction of the posterior horn and body is severely  degenerated.   Lateral meniscus: Intact.   LIGAMENTS   Cruciates: Intact.   Collaterals: Intact.   CARTILAGE   Patellofemoral: Scattered cartilage thinning is worst at the  patellar apex on the superior pole and the lateral facet in the  lower pole.   Medial: Thinned and irregular throughout.   Lateral: Minimally degenerated.   Joint: Small joint effusion.   Popliteal Fossa: Baker's cyst measures approximately 3.5 cm  craniocaudal by 1.5 cm AP x 1 cm transverse.   Extensor Mechanism: Intact.   Bones: No fracture, stress change or worrisome lesion. Mild  patellofemoral and medial compartment osteophytosis is seen. There  is also some subchondral edema about the medial and lateral  compartments, worse medially.   Other: None.   IMPRESSION:  Horizontal tear posterior horn and body of the medial meniscus. The  junction of the posterior horn and body of the medial meniscus is  severely degenerated.   Moderate appearing medial  and patellofemoral osteoarthritis.   Small Baker's cyst.   Electronically Signed  By: Inge Rise M.D.  On: 10/02/2019 13:27  Impression: Internal derangement of the left knee  Plan:  The findings were discussed in detail with the patient. The patient was given informational material on knee arthroscopy. Conservative treatment options were reviewed with the patient. We discussed the risks and benefits of surgical intervention. The usual perioperative course was also discussed in detail. Arthroscopy is an appropriate treatment for the meniscal pathology, but would have limited or no effect on degenerative changes of the articular cartilage. The patient expressed understanding of the risks and benefits of surgical intervention and would like to proceed with plans for left knee arthroscopy.  I spent a total of 45  minutes in both face-to-face and non-face-to-face activities for this visit on the date of this encounter.   MEDICAL CLEARANCE: Per anesthesiology. ACTIVITIES:  Avoid pivoting, squatting, or twisting. WORK STATUS: Not applicable. THERAPY: Quadriceps strengthening exercises. MEDICATIONS: Requested Prescriptions   No prescriptions requested or ordered in this encounter   FOLLOW-UP: Return for postoperative follow-up.   Kyndle Schlender P. Holley Bouche., M.D.  This note was generated in part with voice recognition software and I apologize for any typographical errors that were not detected and corrected.   Electronically signed by Lamar Benes., MD at 10/10/2019 9:31 AM EDT

## 2019-10-22 ENCOUNTER — Telehealth: Payer: Self-pay | Admitting: Oncology

## 2019-10-22 NOTE — Telephone Encounter (Signed)
Patient phoned on this date and stated that she would be out of town on the date of her appt [facility] and needed to cancel her appt. She stated that she would phone back and reschedule at a later time, if needed.

## 2019-10-23 ENCOUNTER — Other Ambulatory Visit: Payer: Self-pay

## 2019-10-23 ENCOUNTER — Encounter
Admission: RE | Admit: 2019-10-23 | Discharge: 2019-10-23 | Disposition: A | Discharge status: Home / Self Care | Payer: BC Managed Care – PPO | Source: Ambulatory Visit | Attending: Orthopedic Surgery | Admitting: Orthopedic Surgery

## 2019-10-23 ENCOUNTER — Encounter: Payer: Self-pay | Admitting: Orthopedic Surgery

## 2019-10-23 DIAGNOSIS — Z01818 Encounter for other preprocedural examination: Secondary | ICD-10-CM | POA: Diagnosis not present

## 2019-10-23 HISTORY — DX: Cardiac arrhythmia, unspecified: I49.9

## 2019-10-23 HISTORY — DX: Headache, unspecified: R51.9

## 2019-10-23 NOTE — Patient Instructions (Addendum)
Your procedure is scheduled on: 10-29-19 [facility] Report to Same Day Surgery 2nd floor medical mall [facility] Entrance-take elevator on left to 2nd floor.  Check in with surgery information desk.) To find out your arrival time please call [phone] between 1PM - 3PM on 10-28-19 TUESDAY  Remember: Instructions that are not followed completely may result in serious medical risk, up to and including death, or upon the discretion of your surgeon and anesthesiologist your surgery may need to be rescheduled.    _x___ 1. Do not eat food after midnight the night before your procedure. NO GUM OR CANDY AFTER MIDNIGHT. You may drink clear liquids up to 2 hours before you are scheduled to arrive at the hospital for your procedure.  Do not drink clear liquids within 2 hours of your scheduled arrival to the hospital.  Clear liquids include  --Water or Apple juice without pulp  --Clear carbohydrate beverage such as ClearFast or Gatorade  --Black Coffee or Clear Tea (No milk, no creamers, do not add anything to the coffee or Tea-OK TO ADD SUGAR)  _X___Ensure clear carbohydrate drink-FINISH DRINK 2 HOURS PRIOR TO ARRIVAL TIME TO HOSPITAL THE DAY OF SURGERY   __x__ 2. No Alcohol for 24 hours before or after surgery.   __x__3. No Smoking or e-cigarettes for 24 prior to surgery.  Do not use any chewable tobacco products for at least 6 hour prior to surgery   ____  4. Bring all medications with you on the day of surgery if instructed.    __x__ 5. Notify your doctor if there is any change in your medical condition     (cold, fever, infections).    x___6. On the morning of surgery brush your teeth with toothpaste and water.  You may rinse your mouth with mouth wash if you wish.  Do not swallow any toothpaste or mouthwash.   Do not wear jewelry, make-up, hairpins, clips or nail polish.  Do not wear lotions, powders, or perfumes.  Do not shave 48 hours prior to surgery. Men may shave face and  neck.  Do not bring valuables to the hospital.    [facility] is not responsible for any belongings or valuables.               Contacts, dentures or bridgework may not be worn into surgery.  Leave your suitcase in the car. After surgery it may be brought to your room.  For patients admitted to the hospital, discharge time is determined by your treatment team.  _  Patients discharged the day of surgery will not be allowed to drive home.  You will need someone to drive you home and stay with you the night of your procedure.    Please read over the following fact sheets that you were given:   [facility] Preparing for Surgery   _x___ TAKE THE FOLLOWING MEDICATION THE MORNING OF SURGERY WITH A SMALL SIP OF WATER. These include:  1. CYMBALTA (DULOXETINE)  2. LIPITOR (ATORVASTATIN)  3.  4.  5.  6.  ____Fleets enema or Magnesium Citrate as directed.   _x___ Use CHG Soap as directed on instruction sheet   ____ Use inhalers on the day of surgery and bring to hospital day of surgery  ____ Stop Metformin and Janumet 2 days prior to surgery.    ____ Take 1/2 of usual insulin dose the night before surgery and none on the morning surgery.   ____ Follow recommendations from Cardiologist, Pulmonologist or PCP  regarding stopping Aspirin, Coumadin, Plavix ,Eliquis, Effient, or Pradaxa, and Pletal.  X____Stop Anti-inflammatories such as Advil, Aleve, Ibuprofen, Motrin, Naproxen, Naprosyn, Goodies powders or aspirin products NOW-OK to take Tylenol    ____ Stop supplements until after surgery.    ____ Bring C-Pap to the hospital.

## 2019-10-27 ENCOUNTER — Other Ambulatory Visit: Payer: Self-pay

## 2019-10-27 ENCOUNTER — Other Ambulatory Visit
Admission: RE | Admit: 2019-10-27 | Discharge: 2019-10-27 | Disposition: A | Discharge status: Home / Self Care | Payer: BC Managed Care – PPO | Source: Ambulatory Visit | Attending: Orthopedic Surgery | Admitting: Orthopedic Surgery

## 2019-10-27 DIAGNOSIS — Z20822 Contact with and (suspected) exposure to covid-19: Secondary | ICD-10-CM | POA: Insufficient documentation

## 2019-10-27 DIAGNOSIS — Z01812 Encounter for preprocedural laboratory examination: Secondary | ICD-10-CM | POA: Diagnosis not present

## 2019-10-28 ENCOUNTER — Encounter: Payer: Self-pay | Admitting: Orthopedic Surgery

## 2019-10-28 LAB — SARS CORONAVIRUS 2 (TAT 6-24 HRS): SARS Coronavirus 2: NEGATIVE

## 2019-10-29 ENCOUNTER — Encounter: Payer: Self-pay | Admitting: Orthopedic Surgery

## 2019-10-29 ENCOUNTER — Other Ambulatory Visit: Payer: Self-pay

## 2019-10-29 ENCOUNTER — Ambulatory Visit: Payer: BC Managed Care – PPO | Admitting: Anesthesiology

## 2019-10-29 ENCOUNTER — Ambulatory Visit
Admission: RE | Admit: 2019-10-29 | Discharge: 2019-10-29 | Disposition: A | Discharge status: Home / Self Care | Payer: BC Managed Care – PPO | Attending: Orthopedic Surgery | Admitting: Orthopedic Surgery

## 2019-10-29 ENCOUNTER — Encounter
Admission: RE | Disposition: A | Discharge status: Home / Self Care | Payer: Self-pay | Source: Home / Self Care | Attending: Orthopedic Surgery

## 2019-10-29 DIAGNOSIS — Z9011 Acquired absence of right breast and nipple: Secondary | ICD-10-CM | POA: Diagnosis not present

## 2019-10-29 DIAGNOSIS — G4730 Sleep apnea, unspecified: Secondary | ICD-10-CM | POA: Insufficient documentation

## 2019-10-29 DIAGNOSIS — M810 Age-related osteoporosis without current pathological fracture: Secondary | ICD-10-CM | POA: Diagnosis not present

## 2019-10-29 DIAGNOSIS — M23222 Derangement of posterior horn of medial meniscus due to old tear or injury, left knee: Secondary | ICD-10-CM | POA: Diagnosis not present

## 2019-10-29 DIAGNOSIS — Z853 Personal history of malignant neoplasm of breast: Secondary | ICD-10-CM | POA: Insufficient documentation

## 2019-10-29 DIAGNOSIS — Z7983 Long term (current) use of bisphosphonates: Secondary | ICD-10-CM | POA: Insufficient documentation

## 2019-10-29 DIAGNOSIS — X58XXXA Exposure to other specified factors, initial encounter: Secondary | ICD-10-CM | POA: Diagnosis not present

## 2019-10-29 DIAGNOSIS — Z803 Family history of malignant neoplasm of breast: Secondary | ICD-10-CM | POA: Diagnosis not present

## 2019-10-29 DIAGNOSIS — M94262 Chondromalacia, left knee: Secondary | ICD-10-CM | POA: Diagnosis not present

## 2019-10-29 DIAGNOSIS — Z96651 Presence of right artificial knee joint: Secondary | ICD-10-CM | POA: Diagnosis not present

## 2019-10-29 DIAGNOSIS — M2242 Chondromalacia patellae, left knee: Secondary | ICD-10-CM | POA: Diagnosis not present

## 2019-10-29 DIAGNOSIS — S83242A Other tear of medial meniscus, current injury, left knee, initial encounter: Secondary | ICD-10-CM | POA: Insufficient documentation

## 2019-10-29 DIAGNOSIS — Z923 Personal history of irradiation: Secondary | ICD-10-CM | POA: Diagnosis not present

## 2019-10-29 DIAGNOSIS — Z880 Allergy status to penicillin: Secondary | ICD-10-CM | POA: Diagnosis not present

## 2019-10-29 DIAGNOSIS — Z9889 Other specified postprocedural states: Secondary | ICD-10-CM

## 2019-10-29 DIAGNOSIS — Z79899 Other long term (current) drug therapy: Secondary | ICD-10-CM | POA: Insufficient documentation

## 2019-10-29 DIAGNOSIS — M2392 Unspecified internal derangement of left knee: Secondary | ICD-10-CM | POA: Diagnosis not present

## 2019-10-29 DIAGNOSIS — E785 Hyperlipidemia, unspecified: Secondary | ICD-10-CM | POA: Diagnosis not present

## 2019-10-29 DIAGNOSIS — F329 Major depressive disorder, single episode, unspecified: Secondary | ICD-10-CM | POA: Insufficient documentation

## 2019-10-29 DIAGNOSIS — G4700 Insomnia, unspecified: Secondary | ICD-10-CM | POA: Diagnosis not present

## 2019-10-29 DIAGNOSIS — C50911 Malignant neoplasm of unspecified site of right female breast: Secondary | ICD-10-CM | POA: Diagnosis not present

## 2019-10-29 HISTORY — DX: Hemorrhagic condition, unspecified: D69.9

## 2019-10-29 HISTORY — PX: KNEE ARTHROSCOPY: SHX127

## 2019-10-29 SURGERY — ARTHROSCOPY, KNEE
Anesthesia: General | Site: Knee | Laterality: Left

## 2019-10-29 MED ORDER — FENTANYL CITRATE (PF) 100 MCG/2ML IJ SOLN: 25.0000 ug | INTRAMUSCULAR | Status: DC | PRN

## 2019-10-29 MED ORDER — OXYCODONE HCL 5 MG PO TABS: 5.0000 mg | ORAL_TABLET | Freq: Once | ORAL | Status: DC | PRN

## 2019-10-29 MED ORDER — CEFAZOLIN SODIUM-DEXTROSE 2-4 GM/100ML-% IV SOLN
2.0000 g | INTRAVENOUS | Status: AC
  Administered 2019-10-29: 2 g via INTRAVENOUS

## 2019-10-29 MED ORDER — LIDOCAINE HCL (PF) 1 % IJ SOLN
INTRAMUSCULAR | Status: AC
  Filled 2019-10-29: qty 2

## 2019-10-29 MED ORDER — LIDOCAINE HCL (CARDIAC) PF 100 MG/5ML IV SOSY
PREFILLED_SYRINGE | INTRAVENOUS | Status: DC | PRN
  Administered 2019-10-29: 80 mg via INTRAVENOUS

## 2019-10-29 MED ORDER — BUPIVACAINE-EPINEPHRINE (PF) 0.25% -1:200000 IJ SOLN
INTRAMUSCULAR | Status: AC
  Filled 2019-10-29: qty 30

## 2019-10-29 MED ORDER — ACETAMINOPHEN 10 MG/ML IV SOLN
INTRAVENOUS | Status: DC | PRN
  Administered 2019-10-29: 1000 mg via INTRAVENOUS

## 2019-10-29 MED ORDER — DEXAMETHASONE SODIUM PHOSPHATE 10 MG/ML IJ SOLN
INTRAMUSCULAR | Status: DC | PRN
  Administered 2019-10-29: 5 mg via INTRAVENOUS

## 2019-10-29 MED ORDER — CHLORHEXIDINE GLUCONATE 0.12 % MT SOLN: 15.0000 mL | Freq: Once | OROMUCOSAL | Status: AC

## 2019-10-29 MED ORDER — ORAL CARE MOUTH RINSE: 15.0000 mL | Freq: Once | OROMUCOSAL | Status: AC

## 2019-10-29 MED ORDER — PROPOFOL 10 MG/ML IV BOLUS
INTRAVENOUS | Status: DC | PRN
  Administered 2019-10-29: 150 mg via INTRAVENOUS

## 2019-10-29 MED ORDER — BUPIVACAINE-EPINEPHRINE 0.25% -1:200000 IJ SOLN
INTRAMUSCULAR | Status: DC | PRN
  Administered 2019-10-29: 30 mL

## 2019-10-29 MED ORDER — PROPOFOL 10 MG/ML IV BOLUS
INTRAVENOUS | Status: AC
  Filled 2019-10-29: qty 20

## 2019-10-29 MED ORDER — MIDAZOLAM HCL 2 MG/2ML IJ SOLN
INTRAMUSCULAR | Status: AC
  Filled 2019-10-29: qty 2

## 2019-10-29 MED ORDER — FENTANYL CITRATE (PF) 100 MCG/2ML IJ SOLN
INTRAMUSCULAR | Status: AC
  Filled 2019-10-29: qty 2

## 2019-10-29 MED ORDER — CELECOXIB 200 MG PO CAPS: 400.0000 mg | ORAL_CAPSULE | Freq: Once | ORAL | Status: AC

## 2019-10-29 MED ORDER — HYDROCODONE-ACETAMINOPHEN 5-325 MG PO TABS: 1.0000 | ORAL_TABLET | ORAL | 0 refills | Status: DC | PRN

## 2019-10-29 MED ORDER — ONDANSETRON HCL 4 MG/2ML IJ SOLN
INTRAMUSCULAR | Status: DC | PRN
  Administered 2019-10-29: 4 mg via INTRAVENOUS

## 2019-10-29 MED ORDER — PHENYLEPHRINE HCL (PRESSORS) 10 MG/ML IV SOLN
INTRAVENOUS | Status: DC | PRN
  Administered 2019-10-29: 100 ug via INTRAVENOUS

## 2019-10-29 MED ORDER — CHLORHEXIDINE GLUCONATE 0.12 % MT SOLN
OROMUCOSAL | Status: AC
  Administered 2019-10-29: 15 mL via OROMUCOSAL
  Filled 2019-10-29: qty 15

## 2019-10-29 MED ORDER — FENTANYL CITRATE (PF) 100 MCG/2ML IJ SOLN
INTRAMUSCULAR | Status: DC | PRN
  Administered 2019-10-29: 12.5 ug via INTRAVENOUS
  Administered 2019-10-29: 25 ug via INTRAVENOUS
  Administered 2019-10-29: 12.5 ug via INTRAVENOUS
  Administered 2019-10-29 (×2): 25 ug via INTRAVENOUS

## 2019-10-29 MED ORDER — CEFAZOLIN SODIUM-DEXTROSE 2-4 GM/100ML-% IV SOLN
INTRAVENOUS | Status: AC
Start: 2019-10-29 — End: 2019-10-29
  Filled 2019-10-29: qty 100

## 2019-10-29 MED ORDER — LIDOCAINE HCL (PF) 2 % IJ SOLN
INTRAMUSCULAR | Status: AC
  Filled 2019-10-29: qty 5

## 2019-10-29 MED ORDER — CELECOXIB 200 MG PO CAPS
ORAL_CAPSULE | ORAL | Status: AC
  Administered 2019-10-29: 400 mg via ORAL
  Filled 2019-10-29: qty 2

## 2019-10-29 MED ORDER — FAMOTIDINE 20 MG PO TABS
ORAL_TABLET | ORAL | Status: AC
Start: 2019-10-29 — End: 2019-10-29
  Administered 2019-10-29: 20 mg via ORAL
  Filled 2019-10-29: qty 1

## 2019-10-29 MED ORDER — MORPHINE SULFATE 4 MG/ML IJ SOLN
INTRAMUSCULAR | Status: DC | PRN
Start: 2019-10-29 — End: 2019-10-29
  Administered 2019-10-29: 4 mg via INTRAVENOUS

## 2019-10-29 MED ORDER — FAMOTIDINE 20 MG PO TABS: 20.0000 mg | ORAL_TABLET | Freq: Once | ORAL | Status: AC

## 2019-10-29 MED ORDER — LACTATED RINGERS IV SOLN: INTRAVENOUS | Status: DC

## 2019-10-29 MED ORDER — MORPHINE SULFATE (PF) 4 MG/ML IV SOLN
INTRAVENOUS | Status: AC
  Filled 2019-10-29: qty 1

## 2019-10-29 MED ORDER — EPHEDRINE SULFATE 50 MG/ML IJ SOLN
INTRAMUSCULAR | Status: DC | PRN
  Administered 2019-10-29 (×2): 10 mg via INTRAVENOUS

## 2019-10-29 MED ORDER — MIDAZOLAM HCL 2 MG/2ML IJ SOLN
INTRAMUSCULAR | Status: DC | PRN
  Administered 2019-10-29: 2 mg via INTRAVENOUS

## 2019-10-29 MED ORDER — OXYCODONE HCL 5 MG/5ML PO SOLN: 5.0000 mg | Freq: Once | ORAL | Status: DC | PRN

## 2019-10-29 SURGICAL SUPPLY — 29 items
ADAPTER IRRIG TUBE 2 SPIKE SOL (ADAPTER) ×6 IMPLANT
ADPR TBG 2 SPK PMP STRL ASCP (ADAPTER) ×2
BLADE SHAVER 4.5 DBL SERAT CV (CUTTER) IMPLANT
COVER WAND RF STERILE (DRAPES) ×3 IMPLANT
CUFF TOURN SGL QUICK 24 (TOURNIQUET CUFF)
CUFF TOURN SGL QUICK 30 (TOURNIQUET CUFF)
CUFF TRNQT CYL 24X4X16.5-23 (TOURNIQUET CUFF) IMPLANT
CUFF TRNQT CYL 30X4X21-28X (TOURNIQUET CUFF) IMPLANT
DRSG DERMACEA 8X12 NADH (GAUZE/BANDAGES/DRESSINGS) ×3 IMPLANT
DURAPREP 26ML APPLICATOR (WOUND CARE) ×6 IMPLANT
GAUZE SPONGE 4X4 12PLY STRL (GAUZE/BANDAGES/DRESSINGS) ×3 IMPLANT
GLOVE BIOGEL M STRL SZ7.5 (GLOVE) ×3 IMPLANT
GLOVE INDICATOR 8.0 STRL GRN (GLOVE) ×3 IMPLANT
GOWN STRL REUS W/ TWL LRG LVL3 (GOWN DISPOSABLE) ×2 IMPLANT
GOWN STRL REUS W/TWL LRG LVL3 (GOWN DISPOSABLE) ×6
IV LACTATED RINGER IRRG 3000ML (IV SOLUTION) ×18
IV LR IRRIG 3000ML ARTHROMATIC (IV SOLUTION) ×6 IMPLANT
KIT TURNOVER KIT A (KITS) ×3 IMPLANT
MANIFOLD NEPTUNE II (INSTRUMENTS) ×3 IMPLANT
PACK KNEE ARTHRO (MISCELLANEOUS) ×3 IMPLANT
SET TUBE SUCT SHAVER OUTFL 24K (TUBING) ×3 IMPLANT
SET TUBE TIP INTRA-ARTICULAR (MISCELLANEOUS) ×3 IMPLANT
SOL PREP PVP 2OZ (MISCELLANEOUS) ×3
SOLUTION PREP PVP 2OZ (MISCELLANEOUS) ×1 IMPLANT
SUT ETHILON 3-0 FS-10 30 BLK (SUTURE) ×3
SUTURE EHLN 3-0 FS-10 30 BLK (SUTURE) ×1 IMPLANT
TUBING ARTHRO INFLOW-ONLY STRL (TUBING) ×3 IMPLANT
WAND HAND CNTRL MULTIVAC 50 (MISCELLANEOUS) ×3 IMPLANT
WRAP KNEE W/COLD PACKS 25.5X14 (SOFTGOODS) ×3 IMPLANT

## 2019-10-29 NOTE — Discharge Instructions (Addendum)
Instructions after Knee Arthroscopy    James P. Hooten, Jr., M.D.     Dept. of Orthopaedics & Sports Medicine  [facility]  [address]  [place], [place]  [address]   Phone: [phone]   Fax: [phone]   DIET: . Drink plenty of non-alcoholic fluids & begin a light diet. . Resume your normal diet the day after surgery.  ACTIVITY:  . You may use crutches or a walker with weight-bearing as tolerated, unless instructed otherwise. . You may wean yourself off of the walker or crutches as tolerated.  . Begin doing gentle exercises. Exercising will reduce the pain and swelling, increase motion, and prevent muscle weakness.   . Avoid strenuous activities or athletics for a minimum of 4-6 weeks after arthroscopic surgery. . Do not drive or operate any equipment until instructed.  WOUND CARE:  . Place one to two pillows under the knee the first day or two when sitting or lying.  . Continue to use the ice packs periodically to reduce pain and swelling. . The small incisions in your knee are closed with nylon stitches. The stitches will be removed in the office. . The bulky dressing may be removed on the second day after surgery. DO NOT TOUCH THE STITCHES. Put a Band-Aid over each stitch. Do NOT use any ointments or creams on the incisions.  . You may bathe or shower after the stitches are removed at the first office visit following surgery.  MEDICATIONS: . You may resume your regular medications. . Please take the pain medication as prescribed. . Do not take pain medication on an empty stomach. . Do not drive or drink alcoholic beverages when taking pain medications.  CALL THE OFFICE FOR: . Temperature above 101 degrees . Excessive bleeding or drainage on the dressing. . Excessive swelling, coldness, or paleness of the toes. . Persistent nausea and vomiting.  FOLLOW-UP:  . You should have an appointment to return to the office in 7-10 days after surgery.        [facility] Department Directory         [email or web address]       [email or web address]          Cardiology  Appointments: [place] - [phone] [place] - [phone]  Endocrinology  Appointments: [place] - [phone] [place] - [phone]  Gastroenterology  Appointments: [place] - [phone] [place] - [phone]        General Surgery   Appointments: [place] - [phone]  Internal Medicine/Family Medicine  Appointments: [place] - [phone] [place] - [phone] [place] - [phone]  Metabolic and Weigh Loss Surgery  Appointments: [place] - [phone]        Neurology  Appointments: [place] - [phone] [place] - [phone]  Neurosurgery  Appointments: [place] - [phone]  Obstetrics & Gynecology  Appointments: [place] - [phone] [place] - [phone]        Pediatrics  Appointments: [place] - [phone] [place] - [phone]  Physiatry  Appointments: [place] -[phone]  Physical Therapy  Appointments: [place] - [phone] [place] - [phone]        Podiatry  Appointments: [place] - [phone] [place] - [phone]  Pulmonology  Appointments: [place] - [phone]  Rheumatology  Appointments: [place] - [phone]        [place] Location: [facility]  [address] , [place]  [address]  [place] Location: [facility] [address] [place], [place]  [address]  [place] Location: [facility] [address] [place], [place]  [address]      AMBULATORY SURGERY    DISCHARGE INSTRUCTIONS   1) The drugs that you were given will stay in your system until tomorrow so for the next 24 hours you should not:  A) Drive an automobile B) Make any legal decisions C) Drink any alcoholic beverage   2) You may resume regular meals tomorrow.  Today it is better to start with liquids and gradually work up to  solid foods.  You may eat anything you prefer, but it is better to start with liquids, then soup and crackers, and gradually work up to solid foods.   3) Please notify your doctor immediately if you have any unusual bleeding, trouble breathing, redness and pain at the surgery site, drainage, fever, or pain not relieved by medication.    4) Additional Instructions:        Please contact your physician with any problems or Same Day Surgery at [phone], Monday through Friday 6 am to 4 pm, or [place] [facility] [place] Main number at [phone]. 

## 2019-10-29 NOTE — Transfer of Care (Signed)
Immediate Anesthesia Transfer of Care Note  Patient: Patricia Kemp  Procedure(s) Performed: ARTHROSCOPY KNEE (Left Knee)  Patient Location: PACU  Anesthesia Type:General  Level of Consciousness: awake, alert  and oriented  Airway & Oxygen Therapy: Patient Spontanous Breathing  Post-op Assessment: Report given to RN and Post -op Vital signs reviewed and stable  Post vital signs: Reviewed and stable  Last Vitals:  Vitals Value Taken Time  BP 112/77 10/29/19 1446  Temp    Pulse 94 10/29/19 1449  Resp 13 10/29/19 1449  SpO2 100 % 10/29/19 1449  Vitals shown include unvalidated device data.  Last Pain:  Vitals:   10/29/19 1051  TempSrc: Temporal  PainSc: 3          Complications: No complications documented.

## 2019-10-29 NOTE — Op Note (Signed)
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   ASSISTANT: none  ANESTHESIA: general  ESTIMATED BLOOD LOSS: Minimal  FLUIDS REPLACED: 800 mL of crystalloid  TOURNIQUET TIME: Not used  INDICATIONS FOR SURGERY: Patricia Kemp is a 59 y.o. year old female who has been seen for complaints of left knee pain. MRI demonstrated findings consistent with meniscal pathology. After discussion of the risks and benefits of surgical intervention, the patient expressed understanding of the risks benefits and agree with plans for left knee arthroscopy.   PROCEDURE IN DETAIL: The patient was brought into the operating room and, after adequate general anesthesia was achieved, a tourniquet was applied to the left thigh and the leg was placed in the leg holder. All bony prominences were well padded. The patient's left knee was cleaned and prepped with alcohol and Duraprep and draped in the usual sterile fashion. A "timeout" was performed as per usual protocol. The anticipated portal sites were injected with 0.25% Marcaine with epinephrine. An anterolateral incision was made and a cannula was inserted. A moderate effusion was evacuated and the knee was distended with fluid using the pump. The scope was advanced down the medial gutter into the medial compartment. Under visualization with the scope, an anteromedial portal was created and a hooked probe was inserted. The medial meniscus was visualized and probed.  There was a complex tear of the posterior horn of the medial meniscus.  The  tear was debrided using meniscal punches and a 4.5 mm shaver.  Final contouring was performed using a 50 degree ArthroCare wand.  The remaining rim of meniscus was visualized and probed and felt to be stable.  The articular cartilage was visualized.  There were grade 2 to early grade 3 changes of chondromalacia involving primarily the medial femoral condyle.  These areas were debrided and contoured using the ArthroCare wand.  The scope was then advanced into the intercondylar notch. The anterior cruciate ligament was visualized and probed and felt to be intact. The scope was removed from the lateral portal and reinserted via the anteromedial portal to better visualize the lateral compartment. The lateral meniscus was visualized and probed.  The lateral meniscus was stable without evidence of tear.  The articular cartilage of the lateral compartment was visualized and noted to be in good condition.  Finally, the scope was advanced so as to visualize the patellofemoral articulation. Good patellar tracking was appreciated.  There were grade 2-3 changes of chondromalacia involving the patella and the intertrochlear groove.  These areas were debrided using the ArthroCare wand.  The knee was irrigated with copius amounts of fluid and suctioned dry. The anterolateral portal was re-approximated with #3-0 nylon. A combination of 0.25% Marcaine with epinephrine and 4 mg of Morphine were injected via the scope. The scope was removed and the anteromedial portal was re-approximated with #3-0 nylon. A sterile dressing was applied followed by application of an ice wrap.  The patient tolerated the procedure well and was transported to the PACU in stable condition.  Patricia Kemp P. Holley Bouche., M.D.

## 2019-10-29 NOTE — H&P (Signed)
The patient has been re-examined, and the chart reviewed, and there have been no interval changes to the documented history and physical.    The risks, benefits, and alternatives have been discussed at length. The patient expressed understanding of the risks benefits and agreed with plans for surgical intervention.  Patricia Kemp, Jr. M.D.    

## 2019-10-29 NOTE — Anesthesia Procedure Notes (Signed)
Procedure Name: LMA Insertion Performed by: Fredderick Phenix, CRNA Pre-anesthesia Checklist: Patient identified, Emergency Drugs available, Suction available and Patient being monitored Patient Re-evaluated:Patient Re-evaluated prior to induction Oxygen Delivery Method: Circle system utilized Preoxygenation: Pre-oxygenation with 100% oxygen Induction Type: IV induction Ventilation: Mask ventilation without difficulty LMA: LMA inserted LMA Size: 3.5 Tube type: Oral Number of attempts: 1 (placed by Dr. Amie Critchley) Airway Equipment and Method: Oral airway Placement Confirmation: positive ETCO2 and breath sounds checked- equal and bilateral Tube secured with: Tape Dental Injury: Teeth and Oropharynx as per pre-operative assessment

## 2019-10-29 NOTE — Anesthesia Preprocedure Evaluation (Signed)
Anesthesia Evaluation  Patient identified by MRN, date of birth, ID band Patient awake    Reviewed: Allergy & Precautions, H&P , NPO status , Patient's Chart, lab work & pertinent test results  History of Anesthesia Complications Negative for: history of anesthetic complications  Airway Mallampati: III  TM Distance: <3 FB Neck ROM: limited    Dental  (+) Chipped   Pulmonary neg shortness of breath, sleep apnea ,    Pulmonary exam normal        Cardiovascular Exercise Tolerance: Good (-) angina(-) Past MI Normal cardiovascular exam+ dysrhythmias      Neuro/Psych  Headaches, PSYCHIATRIC DISORDERS    GI/Hepatic negative GI ROS, Neg liver ROS, neg GERD  ,  Endo/Other  negative endocrine ROS  Renal/GU      Musculoskeletal  (+) Arthritis ,   Abdominal   Peds  Hematology negative hematology ROS (+)   Anesthesia Other Findings Past Medical History: No date: Allergy No date: Arthritis No date: Bleeds easily ([place])     Comment:  PT STATES IT TOOK HER A LITTLE LONGER TO STOP BLEEDING               AFTER SINUS SURGERY 09/2010: Cancer ([place])     Comment:  Right breast cancer No date: Depression No date: Dysrhythmia     Comment:  H/O IRREGULAR HEART BEAT-ASYMPTOMATIC No date: Headache     Comment:  MIGRAINES No date: Heart murmur     Comment:  ASYMPTOMATIC No date: Osteoporosis     Comment:  in spine No date: Sleep apnea     Comment:  NO CPAP-UNABLE TO USE DUE TO SINUS ISSUES  Past Surgical History: 2012: BREAST LUMPECTOMY; Right     Comment:  hx of breast ca rad only 09/2010: BREAST SURGERY; Right No date: CESAREAN SECTION No date: JOINT REPLACEMENT     Comment:  RIGHT KNEE 2008: NASAL SINUS SURGERY  BMI    Body Mass Index: 27.44 kg/m      Reproductive/Obstetrics negative OB ROS                             Anesthesia Physical Anesthesia Plan  ASA: III  Anesthesia Plan:  General LMA   Post-op Pain Management:    Induction: Intravenous  PONV Risk Score and Plan: Dexamethasone, Ondansetron, Midazolam and Treatment may vary due to age or medical condition  Airway Management Planned: LMA  Additional Equipment:   Intra-op Plan:   Post-operative Plan: Extubation in OR  Informed Consent: I have reviewed the patients History and Physical, chart, labs and discussed the procedure including the risks, benefits and alternatives for the proposed anesthesia with the patient or authorized representative who has indicated his/her understanding and acceptance.     Dental Advisory Given  Plan Discussed with: Anesthesiologist, CRNA and Surgeon  Anesthesia Plan Comments: (Patient consented for risks of anesthesia including but not limited to:  - adverse reactions to medications - damage to eyes, teeth, lips or other oral mucosa - nerve damage due to positioning  - sore throat or hoarseness - Damage to heart, brain, nerves, lungs, other parts of body or loss of life  Patient voiced understanding.)        Anesthesia Quick Evaluation

## 2019-10-30 ENCOUNTER — Encounter: Payer: Self-pay | Admitting: Orthopedic Surgery

## 2019-10-30 NOTE — Anesthesia Postprocedure Evaluation (Signed)
Anesthesia Post Note  Patient: Patricia Kemp  Procedure(s) Performed: ARTHROSCOPY KNEE (Left Knee)  Patient location during evaluation: PACU Anesthesia Type: General Level of consciousness: awake and alert Pain management: pain level controlled Vital Signs Assessment: post-procedure vital signs reviewed and stable Respiratory status: spontaneous breathing, nonlabored ventilation, respiratory function stable and patient connected to nasal cannula oxygen Cardiovascular status: blood pressure returned to baseline and stable Postop Assessment: no apparent nausea or vomiting Anesthetic complications: no   No complications documented.   Last Vitals:  Vitals:   10/29/19 1612 10/29/19 1634  BP: 122/77 (!) 111/58  Pulse: 97 89  Resp: 16 16  Temp: 36.9 C 36.9 C  SpO2: 100% 100%    Last Pain:  Vitals:   10/29/19 1634  TempSrc: Temporal  PainSc: 0-No pain                 Precious Haws Sarah Zerby

## 2019-11-07 ENCOUNTER — Other Ambulatory Visit: Payer: Self-pay | Admitting: Physician Assistant

## 2019-11-07 DIAGNOSIS — E785 Hyperlipidemia, unspecified: Secondary | ICD-10-CM

## 2019-11-17 ENCOUNTER — Other Ambulatory Visit: Payer: BC Managed Care – PPO

## 2019-11-18 ENCOUNTER — Ambulatory Visit: Payer: BC Managed Care – PPO | Admitting: Oncology

## 2019-11-25 ENCOUNTER — Ambulatory Visit
Admission: RE | Admit: 2019-11-25 | Discharge: 2019-11-25 | Disposition: A | Discharge status: Home / Self Care | Payer: BC Managed Care – PPO | Source: Ambulatory Visit | Attending: Physician Assistant | Admitting: Physician Assistant

## 2019-11-25 ENCOUNTER — Other Ambulatory Visit: Payer: Self-pay

## 2019-11-25 DIAGNOSIS — M85851 Other specified disorders of bone density and structure, right thigh: Secondary | ICD-10-CM | POA: Diagnosis not present

## 2019-11-25 DIAGNOSIS — M816 Localized osteoporosis [Lequesne]: Secondary | ICD-10-CM | POA: Diagnosis not present

## 2019-11-25 DIAGNOSIS — Z780 Asymptomatic menopausal state: Secondary | ICD-10-CM | POA: Diagnosis not present

## 2019-11-25 DIAGNOSIS — Z853 Personal history of malignant neoplasm of breast: Secondary | ICD-10-CM | POA: Diagnosis not present

## 2019-12-15 ENCOUNTER — Encounter: Payer: Self-pay | Admitting: Physician Assistant

## 2019-12-16 NOTE — Telephone Encounter (Signed)
Called patient and scheduled her on the flu clinic schedule for 12/18/2019.

## 2019-12-18 ENCOUNTER — Ambulatory Visit (INDEPENDENT_AMBULATORY_CARE_PROVIDER_SITE_OTHER): Payer: BC Managed Care – PPO

## 2019-12-18 ENCOUNTER — Other Ambulatory Visit: Payer: Self-pay

## 2019-12-18 DIAGNOSIS — Z23 Encounter for immunization: Secondary | ICD-10-CM | POA: Diagnosis not present

## 2020-02-13 ENCOUNTER — Other Ambulatory Visit: Payer: Self-pay | Admitting: Physician Assistant

## 2020-02-13 DIAGNOSIS — F32A Depression, unspecified: Secondary | ICD-10-CM

## 2020-02-13 DIAGNOSIS — F419 Anxiety disorder, unspecified: Secondary | ICD-10-CM

## 2020-02-13 NOTE — Telephone Encounter (Signed)
Requested medications are due for refill today yes  Requested medications are on the active medication list yes  Last refill 10/2  Last visit Last addressed in 02/04/2019 visit, questioned extending or stopping tx. Not addressed in 08/2019 appt  Future visit scheduled Yes 08/2020  Notes to clinic Please assess, Failed protocol due to no valid visit within 6  months.

## 2020-02-24 ENCOUNTER — Ambulatory Visit: Payer: BC Managed Care – PPO | Admitting: Dermatology

## 2020-02-24 ENCOUNTER — Other Ambulatory Visit: Payer: Self-pay

## 2020-02-24 DIAGNOSIS — D225 Melanocytic nevi of trunk: Secondary | ICD-10-CM | POA: Diagnosis not present

## 2020-02-24 DIAGNOSIS — L821 Other seborrheic keratosis: Secondary | ICD-10-CM | POA: Diagnosis not present

## 2020-02-24 DIAGNOSIS — D229 Melanocytic nevi, unspecified: Secondary | ICD-10-CM

## 2020-02-24 NOTE — Progress Notes (Signed)
   Follow-Up Visit   Subjective  Patricia Kemp is a 60 y.o. female who presents for the following: 6 month follow-up (Recheck moles. No changes noticed.).  Has a couple spots on L jaw she wants checked.   The following portions of the chart were reviewed this encounter and updated as appropriate:       Review of Systems:  No other skin or systemic complaints except as noted in HPI or Assessment and Plan.  Objective  Well appearing patient in no apparent distress; mood and affect are within normal limits.  A focused examination was performed including face, legs, chest, axilla. Relevant physical exam findings are noted in the Assessment and Plan.  Objective  trunk, extremities: R mid sternum: 5.0 x 4.71mm med brown macule with notch slight irregular pigment, (confocal evaluation from previous provider scanned in chart 6/21)   Left pretibia: 3.51mm med dark brown flat pap, (confocal evaluation from previous provider scanned in chart 6/21)   Left axilla: 4.0 x 3.24mm med dark brown pap   Left calf: 4.0 x 3.47mm med dark brown macule lighter superior   Central upper abdomen: 5.0 x 4.54mm med dark brown speckled papule    Objective  Left Mandible x 2: Waxy brown papules.   Assessment & Plan  Nevus trunk, extremities  Benign-appearing.  Stable compared to previous note and pictures. Observation.  Call clinic for new or changing moles.  Recommend daily use of broad spectrum spf 30+ sunscreen to sun-exposed areas.    Seborrheic keratosis Left Mandible x 2  Reassured benign age-related growth.  Recommend observation.  Discussed cryotherapy if spot(s) become irritated or inflamed.   Return in about 6 months (around 08/23/2020) for TBSE.  IJamesetta Orleans, CMA, am acting as scribe for Brendolyn Patty, MD .  Documentation: I have reviewed the above documentation for accuracy and completeness, and I agree with the above.  Brendolyn Patty MD

## 2020-02-24 NOTE — Patient Instructions (Signed)

## 2020-03-19 ENCOUNTER — Telehealth (INDEPENDENT_AMBULATORY_CARE_PROVIDER_SITE_OTHER): Payer: BC Managed Care – PPO | Admitting: Physician Assistant

## 2020-03-19 DIAGNOSIS — J0110 Acute frontal sinusitis, unspecified: Secondary | ICD-10-CM | POA: Diagnosis not present

## 2020-03-19 MED ORDER — DOXYCYCLINE HYCLATE 100 MG PO TABS: 100.0000 mg | ORAL_TABLET | Freq: Two times a day (BID) | ORAL | 0 refills | Status: AC

## 2020-03-19 NOTE — Progress Notes (Signed)
MyChart Video Visit    Virtual Visit via Video Note   This visit type was conducted due to national recommendations for restrictions regarding the COVID-19 Pandemic (e.g. social distancing) in an effort to limit this patient's exposure and mitigate transmission in our community. This patient is at least at moderate risk for complications without adequate follow up. This format is felt to be most appropriate for this patient at this time. Physical exam was limited by quality of the video and audio technology used for the visit.   Patient location: Home Provider location: office   I discussed the limitations of evaluation and management by telemedicine and the availability of in person appointments. The patient expressed understanding and agreed to proceed.  Patient: Patricia Kemp   DOB: August 08, 1960   60 y.o. Female  MRN: [id number] Visit Date: 03/19/2020  Today's healthcare provider: Trinna Post, PA-C   Chief Complaint  Patient presents with  . Sinus Problem  I,Kimberlin Scheel M Ranita Stjulien,acting as a scribe for Trinna Post, PA-C.,have documented all relevant documentation on the behalf of Trinna Post, PA-C,as directed by  Trinna Post, PA-C while in the presence of Trinna Post, PA-C.  Subjective    Sinus Problem This is a new problem. The current episode started 1 to 4 weeks ago. The problem has been gradually worsening since onset. There has been no fever. Her pain is at a severity of 0/10. She is experiencing no pain. Associated symptoms include congestion, ear pain, sinus pressure, sneezing and a sore throat. Pertinent negatives include no chills, coughing, headaches, hoarse voice or shortness of breath. Past treatments include lying down and oral decongestants. The treatment provided mild relief.    Symptoms have been present for 8 days and worsening.      Medications: Outpatient Medications Prior to Visit  Medication Sig  . atorvastatin (LIPITOR) 10 MG tablet  TAKE 1 TABLET BY MOUTH EVERY DAY  . Calcium Citrate-Vitamin D (CALCIUM CITRATE + D PO) Take 2 tablets by mouth daily.   . Cholecalciferol (VITAMIN D3) 125 MCG (5000 UT) TABS Take 5,000 Units by mouth daily.   . DULoxetine (CYMBALTA) 30 MG capsule TAKE 1 CAPSULE BY MOUTH EVERY DAY  . HYDROcodone-acetaminophen (NORCO) 5-325 MG tablet Take 1-2 tablets by mouth every 4 (four) hours as needed for moderate pain.  . Multiple Vitamins-Minerals (WOMENS 50+ MULTI VITAMIN/MIN PO) Take 1 tablet by mouth daily.   . Zinc 50 MG TABS Take 50 mg by mouth daily.   . Triamcinolone Acetonide (NASACORT AQ NA) Place into the nose daily. (Patient not taking: No sig reported)   No facility-administered medications prior to visit.    Review of Systems  Constitutional: Positive for appetite change. Negative for chills, fatigue and fever.  HENT: Positive for congestion, ear pain, postnasal drip, rhinorrhea, sinus pressure, sinus pain, sneezing and sore throat. Negative for hoarse voice.   Respiratory: Negative for cough, chest tightness, shortness of breath and wheezing.   Gastrointestinal: Negative for diarrhea, nausea and vomiting.  Neurological: Negative for weakness and headaches.      Objective    There were no vitals taken for this visit.   Physical Exam     Assessment & Plan    1. Acute non-recurrent frontal sinusitis  - doxycycline (VIBRA-TABS) 100 MG tablet; Take 1 tablet (100 mg total) by mouth 2 (two) times daily for 10 days.  Dispense: 20 tablet; Refill: 0   No follow-ups on file.     I  discussed the assessment and treatment plan with the patient. The patient was provided an opportunity to ask questions and all were answered. The patient agreed with the plan and demonstrated an understanding of the instructions.   The patient was advised to call back or seek an in-person evaluation if the symptoms worsen or if the condition fails to improve as anticipated.   ITrinna Post, PA-C,  have reviewed all documentation for this visit. The documentation on 03/19/20 for the exam, diagnosis, procedures, and orders are all accurate and complete.  The entirety of the information documented in the History of Present Illness, Review of Systems and Physical Exam were personally obtained by me. Portions of this information were initially documented by [facility] and reviewed by me for thoroughness and accuracy.    Paulene Floor [facility] [phone] (phone) [phone] (fax)  [place]

## 2020-03-21 ENCOUNTER — Other Ambulatory Visit: Payer: Self-pay | Admitting: Physician Assistant

## 2020-03-21 DIAGNOSIS — M810 Age-related osteoporosis without current pathological fracture: Secondary | ICD-10-CM

## 2020-03-22 ENCOUNTER — Encounter: Payer: Self-pay | Admitting: Physician Assistant

## 2020-03-22 ENCOUNTER — Other Ambulatory Visit: Payer: Self-pay | Admitting: Physician Assistant

## 2020-03-22 DIAGNOSIS — M810 Age-related osteoporosis without current pathological fracture: Secondary | ICD-10-CM

## 2020-03-22 NOTE — Telephone Encounter (Signed)
Requested medication (s) are due for refill today: Yes  Requested medication (s) are on the active medication list: No (Patient is on this medication-add back to profile)  Last refill:  08/15/19  Future visit scheduled: Yes  Notes to clinic:  Diagnosis Code needed, see patient's MyChart message dated 03/22/20     Requested Prescriptions  Pending Prescriptions Disp Refills   alendronate (FOSAMAX) 70 MG tablet [Pharmacy Med Name: ALENDRONATE SODIUM 70 MG TAB] 12 tablet 1    Sig: Take 1 tablet (70 mg total) by mouth once a week.      Endocrinology:  Bisphosphonates Failed - 03/22/2020 10:56 AM      Failed - Vitamin D in normal range and within 360 days    No results found for: [id number], [id number], [id number], 25OHVITD3, 25OHVITD2, 25OHVITD3, 25OHVITD2, 25OHVITD1, 25OHVITD2, 25OHVITD3, VD25OH        Passed - Ca in normal range and within 360 days    Calcium  Date Value Ref Range Status  08/14/2019 9.3 8.7 - 10.3 mg/dL Final          Passed - Valid encounter within last 12 months    Recent Outpatient Visits           3 days ago Acute non-recurrent frontal sinusitis   [facility] [place], Fabio Bering M, PA-C   7 months ago Annual physical exam   [place], [place], [place]   1 year ago Acute non-recurrent frontal sinusitis   [place], [place], [place]   1 year ago Acute non-recurrent frontal sinusitis   [place], [place], [place]   1 year ago Osteoporosis, unspecified osteoporosis type, unspecified pathological fracture presence   [facility] Trinna Post, [place]       Future Appointments             In 4 months Brendolyn Patty, MD [place]   In 4 months Trinna Post, [place], [place]

## 2020-04-21 ENCOUNTER — Other Ambulatory Visit: Payer: Self-pay

## 2020-04-21 ENCOUNTER — Ambulatory Visit: Payer: BC Managed Care – PPO | Admitting: Physician Assistant

## 2020-04-21 ENCOUNTER — Encounter: Payer: Self-pay | Admitting: Physician Assistant

## 2020-04-21 VITALS — BP 118/84 | HR 82 | Temp 98.8°F | Wt 156.3 lb

## 2020-04-21 DIAGNOSIS — J301 Allergic rhinitis due to pollen: Secondary | ICD-10-CM

## 2020-04-21 MED ORDER — MONTELUKAST SODIUM 10 MG PO TABS: 10.0000 mg | ORAL_TABLET | Freq: Every day | ORAL | 0 refills | Status: DC

## 2020-04-21 NOTE — Progress Notes (Signed)
Established patient visit   Patient: Patricia Kemp   DOB: 1960/09/17   60 y.o. Female  MRN: [id number] Visit Date: 04/21/2020  Today's healthcare provider: Trinna Post, PA-C   Chief Complaint  Patient presents with  . Ear Pain  I,Kenzo Ozment M Daxen Lanum,acting as a scribe for Trinna Post, PA-C.,have documented all relevant documentation on the behalf of Trinna Post, PA-C,as directed by  Trinna Post, PA-C while in the presence of Trinna Post, PA-C.  Subjective    Otalgia  There is pain in both ears. This is a recurrent problem. The current episode started in the past 7 days. The problem occurs constantly. The problem has been unchanged. There has been no fever. The pain is at a severity of 3/10. The pain is mild. Pertinent negatives include no coughing, ear discharge, hearing loss, rhinorrhea, sore throat or vomiting.    History of sinus surgery. Has restarted claritin.      Medications: Outpatient Medications Prior to Visit  Medication Sig  . alendronate (FOSAMAX) 70 MG tablet TAKE 1 TABLET (70 MG TOTAL) BY MOUTH ONCE A WEEK.  Marland Kitchen atorvastatin (LIPITOR) 10 MG tablet TAKE 1 TABLET BY MOUTH EVERY DAY  . Calcium Citrate-Vitamin D (CALCIUM CITRATE + D PO) Take 2 tablets by mouth daily.   . Cholecalciferol (VITAMIN D3) 125 MCG (5000 UT) TABS Take 5,000 Units by mouth daily.   . DULoxetine (CYMBALTA) 30 MG capsule TAKE 1 CAPSULE BY MOUTH EVERY DAY  . Multiple Vitamins-Minerals (WOMENS 50+ MULTI VITAMIN/MIN PO) Take 1 tablet by mouth daily.   . Zinc 50 MG TABS Take 50 mg by mouth daily.   Marland Kitchen HYDROcodone-acetaminophen (NORCO) 5-325 MG tablet Take 1-2 tablets by mouth every 4 (four) hours as needed for moderate pain. (Patient not taking: Reported on 04/21/2020)  . Triamcinolone Acetonide (NASACORT AQ NA) Place into the nose daily. (Patient not taking: No sig reported)   No facility-administered medications prior to visit.    Review of Systems  HENT: Positive for ear  pain. Negative for ear discharge, hearing loss, rhinorrhea and sore throat.   Respiratory: Negative for cough.   Gastrointestinal: Negative for vomiting.       Objective    BP 118/84 (BP Location: Left Arm, Patient Position: Sitting, Cuff Size: Normal)   Pulse 82   Temp 98.8 F (37.1 C) (Oral)   Wt 156 lb 4.8 oz (70.9 kg)   SpO2 97%   BMI 28.59 kg/m     Physical Exam Constitutional:      Appearance: Normal appearance.  Skin:    General: Skin is warm and dry.  Neurological:     General: No focal deficit present.     Mental Status: She is alert and oriented to person, place, and time.  Psychiatric:        Mood and Affect: Mood normal.        Behavior: Behavior normal.       No results found for any visits on 04/21/20.  Assessment & Plan    1. Allergic rhinitis due to pollen, unspecified seasonality  Will add on singulair, suggest continuing claritin.   - montelukast (SINGULAIR) 10 MG tablet; Take 1 tablet (10 mg total) by mouth at bedtime.  Dispense: 90 tablet; Refill: 0   Return if symptoms worsen or fail to improve.      ITrinna Post, PA-C, have reviewed all documentation for this visit. The documentation on 04/27/20 for the exam, diagnosis,  procedures, and orders are all accurate and complete.  The entirety of the information documented in the History of Present Illness, Review of Systems and Physical Exam were personally obtained by me. Portions of this information were initially documented by [facility] and reviewed by me for thoroughness and accuracy.     Paulene Floor  [facility] [phone] (phone) [phone] (fax)  [place]

## 2020-04-21 NOTE — Patient Instructions (Signed)
Allergies, Adult An allergy means that your body reacts to something that bothers it (allergen). This can happen from something that you eat, breathe in, or touch. Allergies often affect the nose, eyes, skin, and stomach. They can be mild, moderate, or very bad (severe). An allergy cannot spread from person to person. They can happen at any age. Sometimes, people outgrow them. What are the causes?  Outdoor things, such as pollen, car fumes, and mold.  Indoor things, such as dust, smoke, mold, and pets.  Foods.  Medicines.  Things that bother your skin, such as perfume and bug bites. What increases the risk?  Having family members with allergies or asthma. What are the signs or symptoms? Symptoms depend on how bad your allergy is. Mild to moderate symptoms  Runny nose, stuffy nose, or sneezing.  Itchy mouth, ears, or throat.  A feeling of mucus dripping down the back of your throat.  Sore throat.  Eyes that are itchy, red, watery, or puffy.  A skin rash, or red, swollen areas of skin (hives).  Stomach cramps or bloating. Severe symptoms Very bad allergies to food, medicine, or bug bites may cause a very bad allergy reaction (anaphylaxis). This can be life-threatening. Symptoms include:  A red face.  Wheezing or coughing.  Swollen lips, tongue, or mouth.  Tight or swollen throat.  Chest pain or tightness, or a fast heartbeat.  Trouble breathing or shortness of breath.  Pain in your belly (abdomen), vomiting, or watery poop (diarrhea).  Feeling dizzy or fainting. How is this treated? Treatment for this condition depends on your symptoms. Treatment may include:  Cold, wet cloths for itching and swelling.  Eye drops, nose sprays, or skin creams.  Washing out your nose each day.  A humidifier.  Medicines.  A change to the foods you eat.  Being exposed again and again to tiny amounts of allergens. This helps your body get used to them. You might  have: ? Allergy shots. ? Very small amounts of allergen put under your tongue.  An emergency shot (auto-injector pen) if you have a very bad allergy reaction. ? This is a medicine with a needle. You can put it into your skin by yourself. ? Your doctor will teach you how to use it.      Follow these instructions at home: Medicines  Take or apply over-the-counter and prescription medicines only as told by your doctor.  If you are at risk for a very bad allergy reaction, keep an auto-injector pen with you all the time.   Eating and drinking  Follow instructions from your doctor about what to eat and drink.  Drink enough fluid to keep your pee (urine) pale yellow. General instructions  If you have ever had a very bad allergy reaction, wear a medical alert bracelet or necklace.  Stay away from things that you are allergic to.  Keep all follow-up visits as told by your doctor. This is important. Contact a doctor if:  Your symptoms do not get better with treatment. Get help right away if:  You have symptoms of a very bad allergy reaction. These include: ? A swollen mouth, tongue, or throat. ? Pain or tightness in your chest. ? Trouble breathing. ? Being short of breath. ? Dizziness. ? Fainting. ? Very bad pain in your belly. ? Vomiting. ? Watery poop. These symptoms may be an emergency. Do not wait to see if the symptoms will go away. Get medical help right away. Call your local   emergency services (911 in the [place] Do not drive yourself to the hospital. Summary  Take or apply over-the-counter and prescription medicines only as told by your doctor.  Stay away from things you are allergic to.  If you are at risk for a very bad allergy reaction, carry an auto-injector pen all the time.  Wear a medical alert bracelet or necklace.  Very bad allergy reactions can be life-threatening. Get help right away. This information is not intended to replace advice given to you by your  health care provider. Make sure you discuss any questions you have with your health care provider. Document Revised: 12/11/2018 Document Reviewed: 12/11/2018 Elsevier Patient Education  2021 [employer] Inc[employer]  

## 2020-04-27 ENCOUNTER — Encounter: Payer: Self-pay | Admitting: Family Medicine

## 2020-06-28 ENCOUNTER — Other Ambulatory Visit: Payer: Self-pay

## 2020-06-28 ENCOUNTER — Ambulatory Visit (INDEPENDENT_AMBULATORY_CARE_PROVIDER_SITE_OTHER): Payer: BC Managed Care – PPO | Admitting: Obstetrics and Gynecology

## 2020-06-28 ENCOUNTER — Encounter: Payer: Self-pay | Admitting: Obstetrics and Gynecology

## 2020-06-28 VITALS — BP 110/60 | Ht 62.0 in | Wt 157.0 lb

## 2020-06-28 DIAGNOSIS — Z853 Personal history of malignant neoplasm of breast: Secondary | ICD-10-CM | POA: Insufficient documentation

## 2020-06-28 DIAGNOSIS — Z1231 Encounter for screening mammogram for malignant neoplasm of breast: Secondary | ICD-10-CM

## 2020-06-28 DIAGNOSIS — Z01419 Encounter for gynecological examination (general) (routine) without abnormal findings: Secondary | ICD-10-CM | POA: Diagnosis not present

## 2020-06-28 DIAGNOSIS — Z803 Family history of malignant neoplasm of breast: Secondary | ICD-10-CM

## 2020-06-28 NOTE — Patient Instructions (Addendum)
I value your feedback and you entrusting us with your care. If you get a [facility] patient survey, I would appreciate you taking the time to let us know about your experience today. Thank you!  [facility]: [phone]      

## 2020-06-28 NOTE — Progress Notes (Signed)
PCP: Trinna Post, PA-C   Chief Complaint  Patient presents with  . Gynecologic Exam    No concerns    HPI:      [name] is a 60 y.o. No obstetric history on file. whose LMP was No LMP recorded. Patient is postmenopausal., presents today for her annual examination.  Her menses are absent due to menopause. Denies PMB. She has occas vasomotor sx.   Sex activity: currently sexually active. She does have vaginal dryness and uses lubricants with relief.  Last Pap: 06/24/18  Results were: no abnormalities /neg HPV DNA.  Hx of STDs: none  Last mammogram: 08/05/19 Results were: normal--routine follow-up in 12 months There is a FH of breast cancer in her mom, genetic testing not indicated for pt in past.  There is no FH of ovarian cancer. Sister with colon cancer. The patient does not do self-breast exams. She has a hx of RT breast cancer age 11. Did radiation and treated with tamoxifen for 5 yrs. No longer doing tx or seeing oncology. Never had genetic testing done since didn't meet criteria at the time, but meets it now. Pt interested in testing.    Colonoscopy: 2018, Repeat due after 5 years. FH colon cancer in her sister.  DEXA: 2019 with osteoporosis in spine and osteopenia in hip; treating with fosamax with PCP. DEXA done 10/21 with osteopenia in spine and hip.   Tobacco use: The patient denies current or previous tobacco use. Alcohol use: social drinker  No drug use Exercise: moderately active  She does get adequate calcium and Vitamin D in her diet.  Labs with PCP.   Past Medical History:  Diagnosis Date  . Allergy   . Arthritis   . Bleeds easily ([place])    PT STATES IT TOOK HER A LITTLE LONGER TO STOP BLEEDING AFTER SINUS SURGERY  . [facility]) 09/2010   Right breast cancer  . Depression   . Dysrhythmia    H/O IRREGULAR HEART BEAT-ASYMPTOMATIC  . Headache    MIGRAINES  . Heart murmur    ASYMPTOMATIC  . Osteoporosis    in spine  . Sleep apnea    NO  CPAP-UNABLE TO USE DUE TO SINUS ISSUES    Past Surgical History:  Procedure Laterality Date  . BREAST LUMPECTOMY Right 2012   hx of breast ca rad only  . BREAST SURGERY Right 09/2010  . CESAREAN SECTION    . JOINT REPLACEMENT     RIGHT KNEE  . KNEE ARTHROSCOPY Left 10/29/2019   Procedure: ARTHROSCOPY KNEE;  Surgeon: Dereck Leep, MD;  Location: [facility] ORS;  Service: Orthopedics;  Laterality: Left;  . NASAL SINUS SURGERY  2008    Family History  Problem Relation Age of Onset  . Breast cancer Mother 18       Breast and lung  . Cancer Paternal Uncle   . Cancer Paternal Grandfather        bone  . Skin cancer Father        forehead  . Colon cancer Sister     Social History   Socioeconomic History  . Marital status: Married    Spouse name: Not on file  . Number of children: Not on file  . Years of education: Not on file  . Highest education level: Not on file  Occupational History  . Not on file  Tobacco Use  . Smoking status: Never Smoker  . Smokeless tobacco: Never Used  Vaping Use  .  Vaping Use: Never used  Substance and Sexual Activity  . Alcohol use: Yes    Alcohol/week: 1.0 standard drink    Types: 1 Cans of beer per week    Comment: rare  . Drug use: Never  . Sexual activity: Yes  Other Topics Concern  . Not on file  Social History Narrative  . Not on file   Social Determinants of Health   Financial Resource Strain: Not on file  Food Insecurity: Not on file  Transportation Needs: Not on file  Physical Activity: Not on file  Stress: Not on file  Social Connections: Not on file  Intimate Partner Violence: Not on file     Current Outpatient Medications:  .  alendronate (FOSAMAX) 70 MG tablet, TAKE 1 TABLET (70 MG TOTAL) BY MOUTH ONCE A WEEK., Disp: 12 tablet, Rfl: 1 .  atorvastatin (LIPITOR) 10 MG tablet, TAKE 1 TABLET BY MOUTH EVERY DAY, Disp: 90 tablet, Rfl: 2 .  Calcium Citrate-Vitamin D (CALCIUM CITRATE + D PO), Take 2 tablets by mouth daily. ,  Disp: , Rfl:  .  Cholecalciferol (VITAMIN D3) 125 MCG (5000 UT) TABS, Take 5,000 Units by mouth daily. , Disp: , Rfl:  .  DULoxetine (CYMBALTA) 30 MG capsule, TAKE 1 CAPSULE BY MOUTH EVERY DAY, Disp: 90 capsule, Rfl: 1 .  Multiple Vitamins-Minerals (WOMENS 50+ MULTI VITAMIN/MIN PO), Take 1 tablet by mouth daily. , Disp: , Rfl:  .  Zinc 50 MG TABS, Take 50 mg by mouth daily. , Disp: , Rfl:      ROS:  Review of Systems  Constitutional: Negative for fatigue, fever and unexpected weight change.  Respiratory: Negative for cough, shortness of breath and wheezing.   Cardiovascular: Negative for chest pain, palpitations and leg swelling.  Gastrointestinal: Negative for blood in stool, constipation, diarrhea, nausea and vomiting.  Endocrine: Negative for cold intolerance, heat intolerance and polyuria.  Genitourinary: Negative for dyspareunia, dysuria, flank pain, frequency, genital sores, hematuria, menstrual problem, pelvic pain, urgency, vaginal bleeding, vaginal discharge and vaginal pain.  Musculoskeletal: Negative for arthralgias, back pain, joint swelling and myalgias.  Skin: Negative for rash.  Neurological: Negative for dizziness, syncope, light-headedness, numbness and headaches.  Hematological: Negative for adenopathy.  Psychiatric/Behavioral: Negative for agitation, confusion, sleep disturbance and suicidal ideas. The patient is not nervous/anxious.   BREAST: No symptoms    Objective: BP 110/60   Ht 5\' 2"  (1.575 m)   Wt 157 lb (71.2 kg)   BMI 28.72 kg/m    Physical Exam Constitutional:      Appearance: She is well-developed.  Genitourinary:     Vulva normal.     Right Labia: No rash, tenderness or lesions.    Left Labia: No tenderness, lesions or rash.    No vaginal discharge, erythema or tenderness.      Right Adnexa: not tender and no mass present.    Left Adnexa: not tender and no mass present.    No cervical friability or polyp.     Uterus is not enlarged or  tender.  Breasts:     Right: No mass, nipple discharge, skin change or tenderness.     Left: No mass, nipple discharge, skin change or tenderness.    Neck:     Thyroid: No thyromegaly.  Cardiovascular:     Rate and Rhythm: Normal rate and regular rhythm.     Heart sounds: Normal heart sounds. No murmur heard.   Pulmonary:     Effort: Pulmonary effort is normal.  Breath sounds: Normal breath sounds.  Abdominal:     Palpations: Abdomen is soft.     Tenderness: There is no abdominal tenderness. There is no guarding or rebound.  Musculoskeletal:        General: Normal range of motion.     Cervical back: Normal range of motion.  Lymphadenopathy:     Cervical: No cervical adenopathy.  Neurological:     General: No focal deficit present.     Mental Status: She is alert and oriented to person, place, and time.     Cranial Nerves: No cranial nerve deficit.  Skin:    General: Skin is warm and dry.  Psychiatric:        Mood and Affect: Mood normal.        Behavior: Behavior normal.        Thought Content: Thought content normal.        Judgment: Judgment normal.  Vitals reviewed.     Assessment/Plan:  Encounter for annual routine gynecological examination  Encounter for screening mammogram for malignant neoplasm of breast - Plan: MM 3D SCREEN BREAST BILATERAL; pt to sched mammo  Family history of breast cancer  History of breast cancer--pt qualifies for [facility] testing. Discussed today and handout given. Pt will RTO for labs--uses numbing cream before blood draws due to needle phobias.          GYN counsel breast self exam, mammography screening, menopause, adequate intake of calcium and vitamin D, diet and exercise    F/U  Return in about 1 year (around 06/28/2021).  Patricia Seldon B. Jerrye Seebeck, PA-C 06/28/2020 1:42 PM

## 2020-07-18 ENCOUNTER — Encounter: Payer: Self-pay | Admitting: Family Medicine

## 2020-08-02 ENCOUNTER — Telehealth: Payer: Self-pay | Admitting: Physician Assistant

## 2020-08-02 DIAGNOSIS — E785 Hyperlipidemia, unspecified: Secondary | ICD-10-CM

## 2020-08-02 MED ORDER — ATORVASTATIN CALCIUM 10 MG PO TABS: 1.0000 | ORAL_TABLET | Freq: Every day | ORAL | 0 refills | Status: DC

## 2020-08-02 NOTE — Telephone Encounter (Signed)
[facility] faxed refill request for the following medications:  atorvastatin (LIPITOR) 10 MG tablet  Last Rx: 11/07/19 Qty: 90 Refills: 2 LOV: 04/21/20 NOV: 09/08/20 with Dr. Caryn Section Please advise. Thanks TNP

## 2020-08-05 ENCOUNTER — Other Ambulatory Visit: Payer: Self-pay

## 2020-08-05 ENCOUNTER — Ambulatory Visit
Admission: RE | Admit: 2020-08-05 | Discharge: 2020-08-05 | Disposition: A | Discharge status: Home / Self Care | Payer: BC Managed Care – PPO | Source: Ambulatory Visit | Attending: Obstetrics and Gynecology | Admitting: Obstetrics and Gynecology

## 2020-08-05 DIAGNOSIS — Z1231 Encounter for screening mammogram for malignant neoplasm of breast: Secondary | ICD-10-CM | POA: Insufficient documentation

## 2020-08-10 ENCOUNTER — Ambulatory Visit: Payer: BC Managed Care – PPO | Admitting: Dermatology

## 2020-08-10 ENCOUNTER — Other Ambulatory Visit: Payer: Self-pay

## 2020-08-10 DIAGNOSIS — D485 Neoplasm of uncertain behavior of skin: Secondary | ICD-10-CM

## 2020-08-10 DIAGNOSIS — D489 Neoplasm of uncertain behavior, unspecified: Secondary | ICD-10-CM

## 2020-08-10 DIAGNOSIS — L578 Other skin changes due to chronic exposure to nonionizing radiation: Secondary | ICD-10-CM

## 2020-08-10 DIAGNOSIS — D225 Melanocytic nevi of trunk: Secondary | ICD-10-CM

## 2020-08-10 DIAGNOSIS — Z1283 Encounter for screening for malignant neoplasm of skin: Secondary | ICD-10-CM | POA: Diagnosis not present

## 2020-08-10 DIAGNOSIS — D2371 Other benign neoplasm of skin of right lower limb, including hip: Secondary | ICD-10-CM

## 2020-08-10 DIAGNOSIS — L821 Other seborrheic keratosis: Secondary | ICD-10-CM

## 2020-08-10 DIAGNOSIS — D229 Melanocytic nevi, unspecified: Secondary | ICD-10-CM

## 2020-08-10 DIAGNOSIS — L814 Other melanin hyperpigmentation: Secondary | ICD-10-CM

## 2020-08-10 DIAGNOSIS — D1800 Hemangioma unspecified site: Secondary | ICD-10-CM

## 2020-08-10 NOTE — Patient Instructions (Addendum)
Seborrheic Keratosis  What causes seborrheic keratoses? Seborrheic keratoses are harmless, common skin growths that first appear during adult life.  As time goes by, more growths appear.  Some people may develop a large number of them.  Seborrheic keratoses appear on both covered and uncovered body parts.  They are not caused by sunlight.  The tendency to develop seborrheic keratoses can be inherited.  They vary in color from skin-colored to gray, brown, or even black.  They can be either smooth or have a rough, warty surface.   Seborrheic keratoses are superficial and look as if they were stuck on the skin.  Under the microscope this type of keratosis looks like layers upon layers of skin.  That is why at times the top layer may seem to fall off, but the rest of the growth remains and re-grows.    Treatment Seborrheic keratoses do not need to be treated, but can easily be removed in the office.  Seborrheic keratoses often cause symptoms when they rub on clothing or jewelry.  Lesions can be in the way of shaving.  If they become inflamed, they can cause itching, soreness, or burning.  Removal of a seborrheic keratosis can be accomplished by freezing, burning, or surgery. If any spot bleeds, scabs, or grows rapidly, please return to have it checked, as these can be an indication of a skin cancer.  Melanoma ABCDEs  Melanoma is the most dangerous type of skin cancer, and is the leading cause of death from skin disease.  You are more likely to develop melanoma if you: Have light-colored skin, light-colored eyes, or red or blond hair Spend a lot of time in the sun Tan regularly, either outdoors or in a tanning bed Have had blistering sunburns, especially during childhood Have a close family member who has had a melanoma Have atypical moles or large birthmarks  Early detection of melanoma is key since treatment is typically straightforward and cure rates are extremely high if we catch it early.   The  first sign of melanoma is often a change in a mole or a new dark spot.  The ABCDE system is a way of remembering the signs of melanoma.  A for asymmetry:  The two halves do not match. B for border:  The edges of the growth are irregular. C for color:  A mixture of colors are present instead of an even brown color. D for diameter:  Melanomas are usually (but not always) greater than 6mm - the size of a pencil eraser. E for evolution:  The spot keeps changing in size, shape, and color.  Please check your skin once per month between visits. You can use a small mirror in front and a large mirror behind you to keep an eye on the back side or your body.   If you see any new or changing lesions before your next follow-up, please call to schedule a visit.  Please continue daily skin protection including broad spectrum sunscreen SPF 30+ to sun-exposed areas, reapplying every 2 hours as needed when you're outdoors.   Staying in the shade or wearing long sleeves, sun glasses (UVA+UVB protection) and wide brim hats (4-inch brim around the entire circumference of the hat) are also recommended for sun protection.    If you have any questions or concerns for your doctor, please call our main line at [phone] and press option 4 to reach your doctor's medical assistant. If no one answers, please leave a voicemail as directed and   we will return your call as soon as possible. Messages left after 4 pm will be answered the following business day.   You may also send us a message via MyChart. We typically respond to MyChart messages within 1-2 business days.  For prescription refills, please ask your pharmacy to contact our office. Our fax number is [phone].  If you have an urgent issue when the clinic is closed that cannot wait until the next business day, you can page your doctor at the number below.    Please note that while we do our best to be available for urgent issues outside of office hours, we  are not available 24/7.   If you have an urgent issue and are unable to reach us, you may choose to seek medical care at your doctor's office, retail clinic, urgent care center, or emergency room.  If you have a medical emergency, please immediately call 911 or go to the emergency department.  Pager Numbers  - Dr. Kowalski: [phone]  - Dr. Moye: [phone]  - Dr. Stewart: [phone]  In the event of inclement weather, please call our main line at [phone] for an update on the status of any delays or closures.  Dermatology Medication Tips: Please keep the boxes that topical medications come in in order to help keep track of the instructions about where and how to use these. Pharmacies typically print the medication instructions only on the boxes and not directly on the medication tubes.   If your medication is too expensive, please contact our office at [phone] option 4 or send us a message through MyChart.   We are unable to tell what your co-pay for medications will be in advance as this is different depending on your insurance coverage. However, we may be able to find a substitute medication at lower cost or fill out paperwork to get insurance to cover a needed medication.   If a prior authorization is required to get your medication covered by your insurance company, please allow us 1-2 business days to complete this process.  Drug prices often vary depending on where the prescription is filled and some pharmacies may offer cheaper prices.  The website www.goodrx.com contains coupons for medications through different pharmacies. The prices here do not account for what the cost may be with help from insurance (it may be cheaper with your insurance), but the website can give you the price if you did not use any insurance.  - You can print the associated coupon and take it with your prescription to the pharmacy.  - You may also stop by our office during regular business  hours and pick up a GoodRx coupon card.  - If you need your prescription sent electronically to a different pharmacy, notify our office through [facility] MyChart or by phone at [phone] option 4.  

## 2020-08-10 NOTE — Progress Notes (Signed)
   Follow-Up Visit   Subjective  Patricia Kemp is a 60 y.o. female who presents for the following: Annual Exam (Patient here today for tbse. She noticed some spots that are looking different at upper mid back near neck, under left ear near cheek feels bumpy, also has a spot of concern on right breast. ).  Patient here for full body skin exam and skin cancer screening.  The following portions of the chart were reviewed this encounter and updated as appropriate:        Objective  Well appearing patient in no apparent distress; mood and affect are within normal limits.  A full examination was performed including scalp, head, eyes, ears, nose, lips, neck, chest, axillae, abdomen, back, buttocks, bilateral upper extremities, bilateral lower extremities, hands, feet, fingers, toes, fingernails, and toenails. All findings within normal limits unless otherwise noted below.  central upper abdomen 5 x 4 med dark brown speckled papule, photo compared, stable  Left Axilla 4 x 3 mm med dark brown pap, photo compared, stable  left pretibia 3.5 mm medium dark brown flat pap (confocal evaluation from previous provider scanned in chart 6/21)  left calf 4 x 3 mm medium dark brown macule with darker inferior  right mid sternum 5 x 4 mm med brown macule with notch slight irregular pigment (confocal evaluation from previous provider scanned in 6/21)  Assessment & Plan  Nevus (2) central upper abdomen; Left Axilla  Benign-appearing.  Observation.  Call clinic for new or changing moles.  Recommend daily use of broad spectrum spf 30+ sunscreen to sun-exposed areas.     Neoplasm of uncertain behavior (3) left pretibia  left calf  right mid sternum  Nevi r/o dysplasia Plan for shave removal and biopsy at next follow up   Lentigines - Scattered tan macules - Due to sun exposure - Benign-appering, observe - Recommend daily broad spectrum sunscreen SPF 30+ to sun-exposed areas, reapply every  2 hours as needed. - Call for any changes  Seborrheic Keratoses - Stuck-on, waxy, tan-brown papules and/or plaques on right spinal upper back, left mandible, right medial breast, perioccular  - Benign-appearing - Discussed benign etiology and prognosis. - Observe - Call for any changes  Dermatofibroma - Firm pink/brown papulenodule with dimple sign on right pretibia  - Benign appearing - Call for any changes  Melanocytic Nevi - Tan-brown and/or pink-flesh-colored symmetric macules and papules - Benign appearing on exam today - Observation - Call clinic for new or changing moles - Recommend daily use of broad spectrum spf 30+ sunscreen to sun-exposed areas.   Hemangiomas - Red papules - Discussed benign nature - Observe - Call for any changes  Actinic Damage - Chronic condition, secondary to cumulative UV/sun exposure - diffuse scaly erythematous macules with underlying dyspigmentation - Recommend daily broad spectrum sunscreen SPF 30+ to sun-exposed areas, reapply every 2 hours as needed.  - Staying in the shade or wearing long sleeves, sun glasses (UVA+UVB protection) and wide brim hats (4-inch brim around the entire circumference of the hat) are also recommended for sun protection.  - Call for new or changing lesions.  Skin cancer screening performed today.  Return in about 3 weeks (around 08/31/2020) for shave removal x 3 . I, Ruthell Rummage, CMA, am acting as scribe for Brendolyn Patty, MD.  Documentation: I have reviewed the above documentation for accuracy and completeness, and I agree with the above.  Brendolyn Patty MD

## 2020-08-17 ENCOUNTER — Encounter: Payer: BLUE CROSS/BLUE SHIELD | Admitting: Physician Assistant

## 2020-08-20 ENCOUNTER — Telehealth: Payer: Self-pay | Admitting: Family Medicine

## 2020-08-20 NOTE — Telephone Encounter (Signed)
This is a patient of Adriana's that was put on my schedule for a CPE on 09-09-2019. Please move her to [facility] schedule. I can't accept new patient's right know. Thanks.

## 2020-08-25 ENCOUNTER — Other Ambulatory Visit: Payer: Self-pay

## 2020-08-25 ENCOUNTER — Ambulatory Visit: Payer: BC Managed Care – PPO | Admitting: Dermatology

## 2020-08-25 DIAGNOSIS — D2272 Melanocytic nevi of left lower limb, including hip: Secondary | ICD-10-CM

## 2020-08-25 DIAGNOSIS — D485 Neoplasm of uncertain behavior of skin: Secondary | ICD-10-CM

## 2020-08-25 DIAGNOSIS — D225 Melanocytic nevi of trunk: Secondary | ICD-10-CM

## 2020-08-25 DIAGNOSIS — D239 Other benign neoplasm of skin, unspecified: Secondary | ICD-10-CM

## 2020-08-25 DIAGNOSIS — D229 Melanocytic nevi, unspecified: Secondary | ICD-10-CM | POA: Diagnosis not present

## 2020-08-25 HISTORY — DX: Other benign neoplasm of skin, unspecified: D23.9

## 2020-08-25 NOTE — Progress Notes (Signed)
Follow-Up Visit   Subjective  Patricia Kemp is a 60 y.o. female who presents for the following: shave removal/biopsies  (L pretibia, L calf, sternum - irregular skin lesions, patient is here today for biopsy and shave removals.).   The following portions of the chart were reviewed this encounter and updated as appropriate:       Review of Systems:  No other skin or systemic complaints except as noted in HPI or Assessment and Plan.  Objective  Well appearing patient in no apparent distress; mood and affect are within normal limits.  A focused examination was performed including the chest and L leg. Relevant physical exam findings are noted in the Assessment and Plan.  L calf 4 x 3 mm medium dark brown macule with darker inferior     L pretibia 3.5 mm medium dark brown flat pap (confocal evaluation from previous provider scanned in chart 6/21)     R mid sternum 5 x 4 mm med brown macule with notch slight irregular pigment (confocal evaluation from previous provider scanned in 6/21)      Assessment & Plan  Neoplasm of uncertain behavior of skin (3) L calf  Epidermal / dermal shaving  Lesion diameter (cm):  0.8 Informed consent: discussed and consent obtained   Patient was prepped and draped in usual sterile fashion: area prepped with alcohol. Anesthesia: the lesion was anesthetized in a standard fashion   Anesthetic:  1% lidocaine w/ epinephrine 1-100,000 buffered w/ 8.4% NaHCO3 Instrument used: flexible razor blade   Hemostasis achieved with: pressure, aluminum chloride and electrodesiccation   Outcome: patient tolerated procedure well   Post-procedure details: wound care instructions given   Post-procedure details comment:  Ointment and small bandage applied  Specimen 1 - Surgical pathology Differential Diagnosis: D48.5 r/o dysplastic nevus Check Margins: No 4 x 3 mm medium dark brown macule with darker inferior  L pretibia  Epidermal / dermal  shaving  Lesion diameter (cm):  0.8 Informed consent: discussed and consent obtained   Patient was prepped and draped in usual sterile fashion: area prepped with alcohol. Anesthesia: the lesion was anesthetized in a standard fashion   Anesthetic:  1% lidocaine w/ epinephrine 1-100,000 buffered w/ 8.4% NaHCO3 Instrument used: flexible razor blade   Hemostasis achieved with: pressure, aluminum chloride and electrodesiccation   Outcome: patient tolerated procedure well   Post-procedure details: wound care instructions given   Post-procedure details comment:  Ointment and small bandage applied  Specimen 2 - Surgical pathology Differential Diagnosis: D48.5 r/o dysplastic nevus  Check Margins: No 3.5 mm medium dark brown flat pap (confocal evaluation from previous provider scanned in chart 6/21)  R mid sternum  Epidermal / dermal shaving  Lesion diameter (cm):  0.9 Informed consent: discussed and consent obtained   Patient was prepped and draped in usual sterile fashion: area prepped with alcohol. Anesthesia: the lesion was anesthetized in a standard fashion   Anesthetic:  1% lidocaine w/ epinephrine 1-100,000 buffered w/ 8.4% NaHCO3 Instrument used: flexible razor blade   Hemostasis achieved with: pressure, aluminum chloride and electrodesiccation   Outcome: patient tolerated procedure well   Post-procedure details: wound care instructions given   Post-procedure details comment:  Ointment and small bandage applied  Specimen 3 - Surgical pathology Differential Diagnosis: D48.5 r/o dysplastic nevus Check Margins: No 5 x 4 mm med brown macule with notch slight irregular pigment (confocal evaluation from previous provider scanned in 6/21)  Melanocytic Nevi - Tan-brown and/or pink-flesh-colored symmetric macules and papules -  Benign appearing on exam today - Observation - Call clinic for new or changing moles - Recommend daily use of broad spectrum spf 30+ sunscreen to sun-exposed  areas.    Return pending bx results.  Luther Redo, CMA, am acting as scribe for Brendolyn Patty, MD .  Documentation: I have reviewed the above documentation for accuracy and completeness, and I agree with the above.  Brendolyn Patty MD

## 2020-08-25 NOTE — Patient Instructions (Signed)

## 2020-09-01 ENCOUNTER — Telehealth: Payer: Self-pay

## 2020-09-01 NOTE — Telephone Encounter (Signed)
-----   Message from Brendolyn Patty, MD sent at 09/01/2020 11:34 AM EDT ----- 1. Skin , left calf DYSPLASTIC NEVUS WITH MODERATE TO SEVERE ATYPIA, IRRITATED, CLOSE TO MARGIN, SEE DESCRIPTION 2. Skin , left pretibia DYSPLASTIC COMPOUND NEVUS WITH MODERATE ATYPIA, IRRITATED, DEEP MARGIN INVOLVED 3. Skin , right mid sternum DYSPLASTIC JUNCTIONAL LENTIGINOUS NEVUS WITH MODERATE TO SEVERE ATYPIA, IRRITATED, CLOSE TO MARGIN, SEE DESCRIPTION  1 and 3. Moderate/severe atypical moles- recommend repeat shave removal, other option would be to recheck sites in 3-4 months and check for recurrence, since they do appear to be removed completely with the shave biopsy, just close to the edge.  If recurrence is seen, then would do repeat shave removal at that time.  If no recurrence would continue to monitor. 2. Moderately atypical mole- observation  - please call patient

## 2020-09-01 NOTE — Telephone Encounter (Signed)
Advised pt of bx results.  Pt scheduled a 3 month f/u to recheck the moderate to severe dysplastic nevi./sh

## 2020-09-08 ENCOUNTER — Encounter: Payer: BC Managed Care – PPO | Admitting: Family Medicine

## 2020-09-14 ENCOUNTER — Telehealth: Payer: Self-pay | Admitting: Family Medicine

## 2020-09-14 DIAGNOSIS — M810 Age-related osteoporosis without current pathological fracture: Secondary | ICD-10-CM

## 2020-09-14 MED ORDER — ALENDRONATE SODIUM 70 MG PO TABS: 70.0000 mg | ORAL_TABLET | ORAL | 1 refills | Status: DC

## 2020-09-14 NOTE — Telephone Encounter (Signed)
[facility] faxed refill request for the following medications:   alendronate (FOSAMAX) 70 MG tablet [   Please advise.

## 2020-10-06 ENCOUNTER — Other Ambulatory Visit: Payer: Self-pay

## 2020-10-06 ENCOUNTER — Encounter: Payer: Self-pay | Admitting: Family Medicine

## 2020-10-06 ENCOUNTER — Ambulatory Visit (INDEPENDENT_AMBULATORY_CARE_PROVIDER_SITE_OTHER): Payer: BC Managed Care – PPO | Admitting: Family Medicine

## 2020-10-06 VITALS — BP 116/72 | HR 92 | Temp 98.2°F | Ht 62.0 in | Wt 157.0 lb

## 2020-10-06 DIAGNOSIS — F40298 Other specified phobia: Secondary | ICD-10-CM | POA: Diagnosis not present

## 2020-10-06 DIAGNOSIS — F419 Anxiety disorder, unspecified: Secondary | ICD-10-CM | POA: Diagnosis not present

## 2020-10-06 DIAGNOSIS — F32A Depression, unspecified: Secondary | ICD-10-CM

## 2020-10-06 DIAGNOSIS — E785 Hyperlipidemia, unspecified: Secondary | ICD-10-CM

## 2020-10-06 DIAGNOSIS — M816 Localized osteoporosis [Lequesne]: Secondary | ICD-10-CM

## 2020-10-06 DIAGNOSIS — Z0000 Encounter for general adult medical examination without abnormal findings: Secondary | ICD-10-CM | POA: Insufficient documentation

## 2020-10-06 DIAGNOSIS — C50911 Malignant neoplasm of unspecified site of right female breast: Secondary | ICD-10-CM

## 2020-10-06 DIAGNOSIS — Z9109 Other allergy status, other than to drugs and biological substances: Secondary | ICD-10-CM

## 2020-10-06 DIAGNOSIS — Z23 Encounter for immunization: Secondary | ICD-10-CM | POA: Insufficient documentation

## 2020-10-06 DIAGNOSIS — Z1159 Encounter for screening for other viral diseases: Secondary | ICD-10-CM | POA: Insufficient documentation

## 2020-10-06 DIAGNOSIS — Z803 Family history of malignant neoplasm of breast: Secondary | ICD-10-CM

## 2020-10-06 DIAGNOSIS — Z853 Personal history of malignant neoplasm of breast: Secondary | ICD-10-CM

## 2020-10-06 MED ORDER — LIDOCAINE-PRILOCAINE 2.5-2.5 % EX CREA: 1.0000 "application " | TOPICAL_CREAM | CUTANEOUS | 0 refills | Status: DC | PRN

## 2020-10-06 NOTE — Assessment & Plan Note (Signed)
UTD on Dexa Continue Fosamax Continue Calcium and Vitd Recommend weight bearing exercises

## 2020-10-06 NOTE — Assessment & Plan Note (Signed)
Using claritin daily No acute complaints Recommend use of flonase during bad seasons- pt will get OTC

## 2020-10-06 NOTE — Assessment & Plan Note (Signed)
Stable On Cymbalta No concerns

## 2020-10-06 NOTE — Patient Instructions (Signed)
Preventive Care 62-62 Years Old, Female Preventive care refers to lifestyle choices and visits with your health care provider that can promote health and wellness. This includes: A yearly physical exam. This is also called an annual wellness visit. Regular dental and eye exams. Immunizations. Screening for certain conditions. Healthy lifestyle choices, such as: Eating a healthy diet. Getting regular exercise. Not using drugs or products that contain nicotine and tobacco. Limiting alcohol use. What can I expect for my preventive care visit? Physical exam Your health care provider will check your: Height and weight. These may be used to calculate your BMI (body mass index). BMI is a measurement that tells if you are at a healthy weight. Heart rate and blood pressure. Body temperature. Skin for abnormal spots. Counseling Your health care provider may ask you questions about your: Past medical problems. Family's medical history. Alcohol, tobacco, and drug use. Emotional well-being. Home life and relationship well-being. Sexual activity. Diet, exercise, and sleep habits. Work and work Statistician. Access to firearms. Method of birth control. Menstrual cycle. Pregnancy history. What immunizations do I need?  Vaccines are usually given at various ages, according to a schedule. Your health care provider will recommend vaccines for you based on your age, medicalhistory, and lifestyle or other factors, such as travel or where you work. What tests do I need? Blood tests Lipid and cholesterol levels. These may be checked every 5 years, or more often if you are over 37 years old. Hepatitis C test. Hepatitis B test. Screening Lung cancer screening. You may have this screening every year starting at age 30 if you have a 30-pack-year history of smoking and currently smoke or have quit within the past 15 years. Colorectal cancer screening. All adults should have this screening starting at  age 23 and continuing until age 3. Your health care provider may recommend screening at age 62 if you are at increased risk. You will have tests every 1-10 years, depending on your results and the type of screening test. Diabetes screening. This is done by checking your blood sugar (glucose) after you have not eaten for a while (fasting). You may have this done every 1-3 years. Mammogram. This may be done every 1-2 years. Talk with your health care provider about when you should start having regular mammograms. This may depend on whether you have a family history of breast cancer. BRCA-related cancer screening. This may be done if you have a family history of breast, ovarian, tubal, or peritoneal cancers. Pelvic exam and Pap test. This may be done every 3 years starting at age 62. Starting at age 62, this may be done every 5 years if you have a Pap test in combination with an HPV test. Other tests STD (sexually transmitted disease) testing, if you are at risk. Bone density scan. This is done to screen for osteoporosis. You may have this scan if you are at high risk for osteoporosis. Talk with your health care provider about your test results, treatment options,and if necessary, the need for more tests. Follow these instructions at home: Eating and drinking  Eat a diet that includes fresh fruits and vegetables, whole grains, lean protein, and low-fat dairy products. Take vitamin and mineral supplements as recommended by your health care provider. Do not drink alcohol if: Your health care provider tells you not to drink. You are pregnant, may be pregnant, or are planning to become pregnant. If you drink alcohol: Limit how much you have to 0-1 drink a day. Be aware  of how much alcohol is in your drink. In the [place] one drink equals one 12 oz bottle of beer (355 mL), one 5 oz glass of wine (148 mL), or one 1 oz glass of hard liquor (44 mL).  Lifestyle Take daily care of your teeth and  gums. Brush your teeth every morning and night with fluoride toothpaste. Floss one time each day. Stay active. Exercise for at least 30 minutes 5 or more days each week. Do not use any products that contain nicotine or tobacco, such as cigarettes, e-cigarettes, and chewing tobacco. If you need help quitting, ask your health care provider. Do not use drugs. If you are sexually active, practice safe sex. Use a condom or other form of protection to prevent STIs (sexually transmitted infections). If you do not wish to become pregnant, use a form of birth control. If you plan to become pregnant, see your health care provider for a prepregnancy visit. If told by your health care provider, take low-dose aspirin daily starting at age 62. Find healthy ways to cope with stress, such as: Meditation, yoga, or listening to music. Journaling. Talking to a trusted person. Spending time with friends and family. Safety Always wear your seat belt while driving or riding in a vehicle. Do not drive: If you have been drinking alcohol. Do not ride with someone who has been drinking. When you are tired or distracted. While texting. Wear a helmet and other protective equipment during sports activities. If you have firearms in your house, make sure you follow all gun safety procedures. What's next? Visit your health care provider once a year for an annual wellness visit. Ask your health care provider how often you should have your eyes and teeth checked. Stay up to date on all vaccines. This information is not intended to replace advice given to you by your health care provider. Make sure you discuss any questions you have with your healthcare provider. Document Revised: 11/04/2019 Document Reviewed: 10/11/2017 Elsevier Patient Education  2022 [employer].

## 2020-10-06 NOTE — Assessment & Plan Note (Signed)
Repeat rx for EMLA

## 2020-10-06 NOTE — Assessment & Plan Note (Signed)
Recommend low saturated fat diet, small meal size, balanced snacks. Increase activity Labs today- non fasting

## 2020-10-06 NOTE — Progress Notes (Signed)
Complete physical exam   Patient: Patricia Kemp   DOB: 21-May-1960   60 y.o. Female  MRN: [id number] Visit Date: 10/06/2020  Today's healthcare provider: [name], FNP   Chief Complaint  Patient presents with   Annual Exam   Subjective    Patricia Kemp is a 61 y.o. female who presents today for a complete physical exam.  She reports consuming a general diet. The patient does not participate in regular exercise at present. She generally feels well. She reports sleeping fairly well. She does not have additional problems to discuss today.   Prefers to get vaccines on another day when she will not be so busy. HPI    Past Medical History:  Diagnosis Date   Allergy    Arthritis    Bleeds easily ([place])    PT STATES IT TOOK HER A LITTLE LONGER TO STOP BLEEDING AFTER SINUS SURGERY   Cancer ([place]) 09/2010   Right breast cancer   Depression    Dysplastic nevus 08/25/2020   R mid sternum, mod to severe atypia   Dysplastic nevus 08/25/2020   L pretibia, mod to severe atypia   Dysplastic nevus 08/25/2020   L calf, mod to severe atypia   Dysrhythmia    H/O IRREGULAR HEART BEAT-ASYMPTOMATIC   Headache    MIGRAINES   Heart murmur    ASYMPTOMATIC   Osteoporosis    in spine   Sleep apnea    NO CPAP-UNABLE TO USE DUE TO SINUS ISSUES   Past Surgical History:  Procedure Laterality Date   BREAST LUMPECTOMY Right 2012   hx of breast ca rad only   BREAST SURGERY Right 09/2010   CESAREAN SECTION     JOINT REPLACEMENT     RIGHT KNEE   KNEE ARTHROSCOPY Left 10/29/2019   Procedure: ARTHROSCOPY KNEE;  Surgeon: Dereck Leep, MD;  Location: [facility] ORS;  Service: Orthopedics;  Laterality: Left;   NASAL SINUS SURGERY  2008   Social History   Socioeconomic History   Marital status: Married    Spouse name: Not on file   Number of children: Not on file   Years of education: Not on file   Highest education level: Not on file  Occupational History   Not on file  Tobacco Use    Smoking status: Never   Smokeless tobacco: Never  Vaping Use   Vaping Use: Never used  Substance and Sexual Activity   Alcohol use: Yes    Alcohol/week: 1.0 standard drink    Types: 1 Cans of beer per week    Comment: rare   Drug use: Never   Sexual activity: Yes  Other Topics Concern   Not on file  Social History Narrative   Not on file   Social Determinants of Health   Financial Resource Strain: Not on file  Food Insecurity: Not on file  Transportation Needs: Not on file  Physical Activity: Not on file  Stress: Not on file  Social Connections: Not on file  Intimate Partner Violence: Not on file   Family Status  Relation Name Status   Mother  Deceased   Annamarie Major  Deceased   PGF  Deceased   Father  Alive   Sister  Alive   Family History  Problem Relation Age of Onset   Breast cancer Mother 72       Breast and lung   Cancer Paternal Uncle    Cancer Paternal Grandfather  bone   Skin cancer Father        forehead   Colon cancer Sister    Allergies  Allergen Reactions   Amoxicillin Nausea Only    Patient Care Team: [name], FNP as PCP - General (Family Medicine)   Medications: Outpatient Medications Prior to Visit  Medication Sig   alendronate (FOSAMAX) 70 MG tablet Take 1 tablet (70 mg total) by mouth once a week.   atorvastatin (LIPITOR) 10 MG tablet Take 1 tablet (10 mg total) by mouth daily.   Calcium Citrate-Vitamin D (CALCIUM CITRATE + D PO) Take 2 tablets by mouth daily.    Cholecalciferol (VITAMIN D3) 125 MCG (5000 UT) TABS Take 5,000 Units by mouth daily.    DULoxetine (CYMBALTA) 30 MG capsule TAKE 1 CAPSULE BY MOUTH EVERY DAY   Multiple Vitamins-Minerals (WOMENS 50+ MULTI VITAMIN/MIN PO) Take 1 tablet by mouth daily.    Zinc 50 MG TABS Take 50 mg by mouth daily.    No facility-administered medications prior to visit.    Review of Systems  HENT:  Positive for congestion.   Respiratory:  Positive for apnea.   Allergic/Immunologic:  Positive for environmental allergies.  All other systems reviewed and are negative.  Previous CPAP wearer; has tried multiple masks- now sleeps on side. Recommend refitting- current refusal. Last CBC Lab Results  Component Value Date   WBC 6.3 08/14/2019   HGB 13.4 08/14/2019   HCT 41.1 08/14/2019   MCV 89 08/14/2019   MCH 28.9 08/14/2019   RDW 13.0 08/14/2019   PLT 207 [id number]   Last metabolic panel Lab Results  Component Value Date   GLUCOSE 87 08/14/2019   NA 142 08/14/2019   K 3.9 08/14/2019   CL 105 08/14/2019   CO2 25 08/14/2019   BUN 17 08/14/2019   CREATININE 0.79 08/14/2019   GFRNONAA 81 08/14/2019   GFRAA 93 08/14/2019   CALCIUM 9.3 08/14/2019   PROT 6.4 08/14/2019   ALBUMIN 4.5 08/14/2019   LABGLOB 1.9 08/14/2019   AGRATIO 2.4 (H) 08/14/2019   BILITOT 0.5 08/14/2019   ALKPHOS 80 08/14/2019   AST 21 08/14/2019   ALT 16 08/14/2019   Last lipids Lab Results  Component Value Date   CHOL 180 08/14/2019   HDL 76 08/14/2019   LDLCALC 87 08/14/2019   TRIG 94 08/14/2019   CHOLHDL 2.4 08/14/2019   Last hemoglobin A1c No results found for: HGBA1C Last thyroid functions Lab Results  Component Value Date   TSH 1.810 08/14/2019   Last vitamin D No results found for: 25OHVITD2, 25OHVITD3, VD25OH Last vitamin B12 and Folate No results found for: VITAMINB12, FOLATE    Objective    BP 116/72 (BP Location: Left Arm, Patient Position: Sitting, Cuff Size: Normal)   Pulse 92   Temp 98.2 F (36.8 C) (Oral)   Ht '5\' 2"'$  (1.575 m)   Wt 157 lb (71.2 kg)   SpO2 98%   BMI 28.72 kg/m  BP Readings from Last 3 Encounters:  10/06/20 116/72  06/28/20 110/60  04/21/20 118/84   Wt Readings from Last 3 Encounters:  10/06/20 157 lb (71.2 kg)  06/28/20 157 lb (71.2 kg)  04/21/20 156 lb 4.8 oz (70.9 kg)      Physical Exam Vitals and nursing note reviewed.  Constitutional:      General: She is not in acute distress.    Appearance: Normal appearance. She is  well-developed, well-groomed and overweight. She is not ill-appearing, toxic-appearing or diaphoretic.  HENT:     Head: Normocephalic and atraumatic.     Right Ear: Tympanic membrane, ear canal and external ear normal. There is no impacted cerumen.     Left Ear: Tympanic membrane, ear canal and external ear normal.     Nose: Nose normal. No congestion or rhinorrhea.     Mouth/Throat:     Mouth: Mucous membranes are moist.     Pharynx: Oropharynx is clear. No oropharyngeal exudate or posterior oropharyngeal erythema.  Eyes:     General:        Right eye: No discharge.        Left eye: No discharge.     Extraocular Movements: Extraocular movements intact.     Conjunctiva/sclera: Conjunctivae normal.     Pupils: Pupils are equal, round, and reactive to light.  Neck:     Vascular: No carotid bruit.  Cardiovascular:     Rate and Rhythm: Normal rate and regular rhythm.     Pulses: Normal pulses.     Heart sounds: Normal heart sounds. No murmur heard.   No friction rub. No gallop.  Pulmonary:     Effort: Pulmonary effort is normal. No respiratory distress.     Breath sounds: Normal breath sounds. No stridor. No wheezing, rhonchi or rales.  Chest:     Chest wall: No tenderness.  Abdominal:     General: Bowel sounds are normal. There is no distension.     Palpations: Abdomen is soft. There is no mass.     Tenderness: There is no abdominal tenderness. There is no guarding or rebound.     Hernia: No hernia is present.  Genitourinary:    Comments: Exam deferred  Musculoskeletal:        General: No swelling, tenderness, deformity or signs of injury. Normal range of motion.     Cervical back: Normal range of motion and neck supple. No rigidity or tenderness.     Right lower leg: No edema.     Left lower leg: No edema.  Lymphadenopathy:     Cervical: No cervical adenopathy.  Skin:    General: Skin is warm and dry.     Capillary Refill: Capillary refill takes less than 2 seconds.      Coloration: Skin is not jaundiced or pale.     Findings: No bruising, erythema, lesion or rash.  Neurological:     General: No focal deficit present.     Mental Status: She is alert and oriented to person, place, and time. Mental status is at baseline.     Cranial Nerves: No cranial nerve deficit.     Sensory: No sensory deficit.     Motor: No weakness.     Coordination: Coordination normal.     Gait: Gait normal.     Deep Tendon Reflexes: Reflexes normal.  Psychiatric:        Mood and Affect: Mood normal.        Behavior: Behavior normal. Behavior is cooperative.        Thought Content: Thought content normal.        Judgment: Judgment normal.      Last depression screening scores PHQ 2/9 Scores 10/06/2020 04/21/2020 08/14/2019  PHQ - 2 Score 0 1 0  PHQ- 9 Score 2 4 -   Last fall risk screening Fall Risk  10/06/2020  Falls in the past year? 0  Number falls in past yr: 0  Injury with Fall? 0  Risk for fall due to : No  Fall Risks  Follow up -   Last Audit-C alcohol use screening Alcohol Use Disorder Test (AUDIT) 10/06/2020  1. How often do you have a drink containing alcohol? 1  2. How many drinks containing alcohol do you have on a typical day when you are drinking? 0  3. How often do you have six or more drinks on one occasion? 0  AUDIT-C Score 1  Alcohol Brief Interventions/Follow-up -   A score of 3 or more in women, and 4 or more in men indicates increased risk for alcohol abuse, EXCEPT if all of the points are from question 1   No results found for any visits on 10/06/20.  Assessment & Plan    Routine Health Maintenance and Physical Exam  Exercise Activities and Dietary recommendations  Goals   None     Immunization History  Administered Date(s) Administered   Fluad Quad(high Dose 65+) 12/18/2019   Influenza,inj,Quad PF,6+ Mos 10/23/2018   PFIZER Comirnaty(Gray Top)Covid-19 Tri-Sucrose Vaccine 07/21/2020   PFIZER(Purple Top)SARS-COV-2 Vaccination 05/05/2019,  05/26/2019, 11/28/2019   Td 08/14/2019    Health Maintenance  Topic Date Due   Pneumococcal Vaccine 72-87 Years old (1 - PCV) Never done   COLONOSCOPY (Pts 45-30yr Insurance coverage will need to be confirmed)  Never done   Hepatitis C Screening  Never done   Zoster Vaccines- Shingrix (1 of 2) Never done   INFLUENZA VACCINE  09/13/2020   COVID-19 Vaccine (5 - Booster for Pfizer series) 11/20/2020   PAP SMEAR-Modifier  06/23/2021   MAMMOGRAM  08/06/2022   TETANUS/TDAP  08/13/2029   HIV Screening  Completed   HPV VACCINES  Aged Out    Discussed health benefits of physical activity, and encouraged her to engage in regular exercise appropriate for her age and condition.  Problem List Items Addressed This Visit       Musculoskeletal and Integument   Osteoporosis    UTD on Dexa Continue Fosamax Continue Calcium and Vitd Recommend weight bearing exercises        Other   Environmental allergies    Using claritin daily No acute complaints Recommend use of flonase during bad seasons- pt will get OTC      Malignant neoplasm of right female breast (HCC)    In remission No BP R arm Routine cell counts today No longer followed by onc      Anxiety and depression    Stable On Cymbalta No concerns      Relevant Orders   CBC with Differential/Platelet   Comprehensive metabolic panel   Lipid panel   Magnesium   TSH   Vitamin B12   VITAMIN D 25 Hydroxy (Vit-D Deficiency, Fractures)   Hemoglobin A1c   Hepatitis C antibody   Hyperlipidemia    Recommend low saturated fat diet, small meal size, balanced snacks. Increase activity Labs today- non fasting      Relevant Orders   CBC with Differential/Platelet   Comprehensive metabolic panel   Lipid panel   Magnesium   TSH   Vitamin B12   VITAMIN D 25 Hydroxy (Vit-D Deficiency, Fractures)   Hemoglobin A1c   Hepatitis C antibody   Family history of breast cancer    Personal and family hx      Routine general  medical examination at health care facility - Primary    Chronic concerns No acute complaints Refusing vaccinations today given needle phobia and need for premedication with EMLA cream prior Due for colo 23- [place]  Relevant Orders   CBC with Differential/Platelet   Comprehensive metabolic panel   Lipid panel   Magnesium   TSH   Vitamin B12   VITAMIN D 25 Hydroxy (Vit-D Deficiency, Fractures)   Hemoglobin A1c   Hepatitis C antibody   Encounter for hepatitis C screening test for low risk patient    Routine screening      Relevant Orders   Hepatitis C antibody   Needle phobia    Repeat rx for EMLA      Relevant Medications   lidocaine-prilocaine (EMLA) cream   RESOLVED: History of breast cancer     Return in about 1 year (around 10/06/2021) for annual examination, chonic disease management.     [name], [place] [phone] (phone) [phone] (fax)  [place]

## 2020-10-06 NOTE — Assessment & Plan Note (Signed)
Personal and family hx

## 2020-10-06 NOTE — Assessment & Plan Note (Addendum)
Chronic concerns No acute complaints Refusing vaccinations today given needle phobia and need for premedication with EMLA cream prior Due for colo 21- prev done in [place]

## 2020-10-06 NOTE — Assessment & Plan Note (Signed)
In remission No BP R arm Routine cell counts today No longer followed by onc

## 2020-10-06 NOTE — Assessment & Plan Note (Signed)
Routine screening

## 2020-10-07 LAB — COMPREHENSIVE METABOLIC PANEL
ALT: 22 IU/L (ref 0–32)
AST: 25 IU/L (ref 0–40)
Albumin/Globulin Ratio: 2.9 — ABNORMAL HIGH (ref 1.2–2.2)
Albumin: 4.7 g/dL (ref 3.8–4.8)
Alkaline Phosphatase: 112 IU/L (ref 44–121)
BUN/Creatinine Ratio: 18 (ref 12–28)
BUN: 14 mg/dL (ref 8–27)
Bilirubin Total: 0.6 mg/dL (ref 0.0–1.2)
CO2: 23 mmol/L (ref 20–29)
Calcium: 9.4 mg/dL (ref 8.7–10.3)
Chloride: 104 mmol/L (ref 96–106)
Creatinine, Ser: 0.76 mg/dL (ref 0.57–1.00)
Globulin, Total: 1.6 g/dL (ref 1.5–4.5)
Glucose: 137 mg/dL — ABNORMAL HIGH (ref 65–99)
Potassium: 3.9 mmol/L (ref 3.5–5.2)
Sodium: 142 mmol/L (ref 134–144)
Total Protein: 6.3 g/dL (ref 6.0–8.5)
eGFR: 89 mL/min/{1.73_m2} (ref >59)

## 2020-10-07 LAB — CBC WITH DIFFERENTIAL/PLATELET
Basophils Absolute: 0.1 10*3/uL (ref 0.0–0.2)
Basos: 1 %
EOS (ABSOLUTE): 0.1 10*3/uL (ref 0.0–0.4)
Eos: 2 %
Hematocrit: 40.4 % (ref 34.0–46.6)
Hemoglobin: 13.3 g/dL (ref 11.1–15.9)
Immature Grans (Abs): 0 10*3/uL (ref 0.0–0.1)
Immature Granulocytes: 0 %
Lymphocytes Absolute: 2.1 10*3/uL (ref 0.7–3.1)
Lymphs: 34 %
MCH: 28.5 pg (ref 26.6–33.0)
MCHC: 32.9 g/dL (ref 31.5–35.7)
MCV: 87 fL (ref 79–97)
Monocytes Absolute: 0.4 10*3/uL (ref 0.1–0.9)
Monocytes: 6 %
Neutrophils Absolute: 3.6 10*3/uL (ref 1.4–7.0)
Neutrophils: 57 %
Platelets: 222 10*3/uL (ref 150–450)
RBC: 4.67 x10E6/uL (ref 3.77–5.28)
RDW: 12.2 % (ref 11.7–15.4)
WBC: 6.2 10*3/uL (ref 3.4–10.8)

## 2020-10-07 LAB — LIPID PANEL
Chol/HDL Ratio: 3.2 ratio (ref 0.0–4.4)
Cholesterol, Total: 198 mg/dL (ref 100–199)
HDL: 61 mg/dL (ref >39)
LDL Chol Calc (NIH): 111 mg/dL — ABNORMAL HIGH (ref 0–99)
Triglycerides: 152 mg/dL — ABNORMAL HIGH (ref 0–149)
VLDL Cholesterol Cal: 26 mg/dL (ref 5–40)

## 2020-10-07 LAB — HEMOGLOBIN A1C
Est. average glucose Bld gHb Est-mCnc: 123 mg/dL
Hgb A1c MFr Bld: 5.9 % — ABNORMAL HIGH (ref 4.8–5.6)

## 2020-10-07 LAB — VITAMIN B12: Vitamin B-12: 643 pg/mL (ref 232–1245)

## 2020-10-07 LAB — VITAMIN D 25 HYDROXY (VIT D DEFICIENCY, FRACTURES): Vit D, 25-Hydroxy: 72.4 ng/mL (ref 30.0–100.0)

## 2020-10-07 LAB — TSH: TSH: 1.92 u[IU]/mL (ref 0.450–4.500)

## 2020-10-07 LAB — MAGNESIUM: Magnesium: 2.2 mg/dL (ref 1.6–2.3)

## 2020-10-07 LAB — HEPATITIS C ANTIBODY: Hep C Virus Ab: <0.1 s/co ratio (ref 0.0–0.9)

## 2020-10-07 NOTE — Progress Notes (Signed)
Hello,    Your lab results have returned.  Cell count is normal.  Chemistry showed elevated glucose at 137. Goal of <100 for fasting and post-prandial. Your A1c shows pre- diabetes- recommend lower carb meals, goal 25% of meal; smaller meal size; balanced snacks; choosing whole foods vs prepared/processed; choosing water as drink of choice and goal of 150 mins/wk exercise at minimum.   Cholesterol shows elevation in trig/fats and bad/LDL cholesterol. recommend diet low in saturated fat and regular exercise - 30 min at least 5 times per week   The 10-year ASCVD risk score Mikey Bussing DC Brooke Bonito., et al., 2013) is: 3.1%   Values used to calculate the score:     Age: 60 years     Sex: Female     Is Non-Hispanic African American: No     Diabetic: No     Tobacco smoker: No     Systolic Blood Pressure: 99991111 mmHg     Is BP treated: No     HDL Cholesterol: 61 mg/dL     Total Cholesterol: 198 mg/dL  Neg Hep C screening.  Vitamin B12/D normal.  Magnesium normal.  Thyroid normal.  Please let us know if you have any questions.  Thank you,  Tally Joe, FNP

## 2020-10-10 ENCOUNTER — Encounter: Payer: Self-pay | Admitting: Family Medicine

## 2020-10-19 ENCOUNTER — Encounter: Payer: Self-pay | Admitting: Family Medicine

## 2020-10-19 DIAGNOSIS — F32A Depression, unspecified: Secondary | ICD-10-CM

## 2020-10-19 DIAGNOSIS — F419 Anxiety disorder, unspecified: Secondary | ICD-10-CM

## 2020-10-19 MED ORDER — DULOXETINE HCL 30 MG PO CPEP
ORAL_CAPSULE | ORAL | 1 refills | Status: DC
Start: 2020-10-19 — End: 2021-01-10

## 2020-10-26 ENCOUNTER — Other Ambulatory Visit: Payer: Self-pay | Admitting: Family Medicine

## 2020-10-26 DIAGNOSIS — E785 Hyperlipidemia, unspecified: Secondary | ICD-10-CM

## 2020-11-04 ENCOUNTER — Telehealth: Payer: Self-pay

## 2020-11-04 NOTE — Telephone Encounter (Signed)
Pt called complaining of swelling in her hands and feet.  She says it has been going on for several months but thought it was due to the heat.  She says she can't get her rings off right now.  (She reports her fingers are fine not changing color or painful)  She denies a major increase in weight, shortness of breath, chest pain etc...   I scheduled her to see you 11/17/2020 she was wondering if you can work her in sooner.  Pt does not want to see Daneil Dan.  She states "It's nothing against Daneil Dan but I don't feel comfortable with her."  She would like to establish care with one of the other providers once their schedule opens.     Thanks,   -Mickel Baas

## 2020-11-08 ENCOUNTER — Other Ambulatory Visit (HOSPITAL_COMMUNITY)
Admission: RE | Admit: 2020-11-08 | Discharge: 2020-11-08 | Disposition: A | Discharge status: Home / Self Care | Payer: BC Managed Care – PPO | Source: Ambulatory Visit | Attending: Obstetrics and Gynecology | Admitting: Obstetrics and Gynecology

## 2020-11-08 ENCOUNTER — Encounter: Payer: Self-pay | Admitting: Obstetrics and Gynecology

## 2020-11-08 ENCOUNTER — Ambulatory Visit: Payer: BC Managed Care – PPO | Admitting: Obstetrics and Gynecology

## 2020-11-08 ENCOUNTER — Other Ambulatory Visit: Payer: Self-pay

## 2020-11-08 VITALS — BP 110/64 | Ht 61.5 in | Wt 158.0 lb

## 2020-11-08 DIAGNOSIS — Z1151 Encounter for screening for human papillomavirus (HPV): Secondary | ICD-10-CM

## 2020-11-08 DIAGNOSIS — Z853 Personal history of malignant neoplasm of breast: Secondary | ICD-10-CM

## 2020-11-08 DIAGNOSIS — N950 Postmenopausal bleeding: Secondary | ICD-10-CM | POA: Diagnosis not present

## 2020-11-08 DIAGNOSIS — Z124 Encounter for screening for malignant neoplasm of cervix: Secondary | ICD-10-CM

## 2020-11-08 NOTE — Progress Notes (Signed)
Patricia Sprout, FNP   Chief Complaint  Patient presents with   Vaginal Discharge    No odor or itchiness x one week    HPI:      [name] is a 60 y.o. G1P1001 whose LMP was No LMP recorded. Patient is postmenopausal., presents today for PMB infrequently over the past yr. Sx have happened 3-4 times but when noted it twice last wk, she thought she needed to be evaluated. Didn't realize it was PMB since brown and only with wiping once when it happens. No dysmen/pelvic pain. Not sex active. LMP about 10 yrs ago. Neg pap/neg HPV DNA 5/20. No vag itching/irritation. No urin sx. No FH uterine cancer.  She has a hx of RT breast cancer age 89. Did radiation and treated with tamoxifen for 5 yrs. No longer doing tx or seeing oncology. Never had genetic testing done since didn't meet criteria at the time, but meets it now. Pt interested in testing after annual but unsure about insurance coverage. Would still like to get it done.    Past Medical History:  Diagnosis Date   Allergy    Arthritis    Bleeds easily ([place])    PT STATES IT TOOK HER A LITTLE LONGER TO STOP BLEEDING AFTER SINUS SURGERY   Cancer ([place]) 09/2010   Right breast cancer   Depression    Dysplastic nevus 08/25/2020   R mid sternum, mod to severe atypia   Dysplastic nevus 08/25/2020   L pretibia, mod to severe atypia   Dysplastic nevus 08/25/2020   L calf, mod to severe atypia   Dysrhythmia    H/O IRREGULAR HEART BEAT-ASYMPTOMATIC   Headache    MIGRAINES   Heart murmur    ASYMPTOMATIC   Osteoporosis    in spine   Sleep apnea    NO CPAP-UNABLE TO USE DUE TO SINUS ISSUES    Past Surgical History:  Procedure Laterality Date   BREAST LUMPECTOMY Right 2012   hx of breast ca rad only   BREAST SURGERY Right 09/2010   CESAREAN SECTION     JOINT REPLACEMENT     RIGHT KNEE   KNEE ARTHROSCOPY Left 10/29/2019   Procedure: ARTHROSCOPY KNEE;  Surgeon: Dereck Leep, MD;  Location: [facility] ORS;  Service: Orthopedics;   Laterality: Left;   NASAL SINUS SURGERY  2008    Family History  Problem Relation Age of Onset   Breast cancer Mother 70       Breast and lung   Cancer Paternal Uncle    Cancer Paternal Grandfather        bone   Skin cancer Father        forehead   Colon cancer Sister     Social History   Socioeconomic History   Marital status: Married    Spouse name: Not on file   Number of children: Not on file   Years of education: Not on file   Highest education level: Not on file  Occupational History   Not on file  Tobacco Use   Smoking status: Never   Smokeless tobacco: Never  Vaping Use   Vaping Use: Never used  Substance and Sexual Activity   Alcohol use: Yes    Alcohol/week: 1.0 standard drink    Types: 1 Cans of beer per week    Comment: rare   Drug use: Never   Sexual activity: Yes  Other Topics Concern   Not on file  Social History  Narrative   Not on file   Social Determinants of Health   Financial Resource Strain: Not on file  Food Insecurity: Not on file  Transportation Needs: Not on file  Physical Activity: Not on file  Stress: Not on file  Social Connections: Not on file  Intimate Partner Violence: Not on file    Outpatient Medications Prior to Visit  Medication Sig Dispense Refill   alendronate (FOSAMAX) 70 MG tablet Take 1 tablet (70 mg total) by mouth once a week. 12 tablet 1   atorvastatin (LIPITOR) 10 MG tablet TAKE 1 TABLET BY MOUTH EVERY DAY 90 tablet 1   Calcium Citrate-Vitamin D (CALCIUM CITRATE + D PO) Take 2 tablets by mouth daily.      Cholecalciferol (VITAMIN D3) 125 MCG (5000 UT) TABS Take 5,000 Units by mouth daily.      DULoxetine (CYMBALTA) 30 MG capsule TAKE 1 CAPSULE BY MOUTH EVERY DAY 90 capsule 1   lidocaine-prilocaine (EMLA) cream Apply 1 application topically as needed. 30 g 0   Multiple Vitamins-Minerals (WOMENS 50+ MULTI VITAMIN/MIN PO) Take 1 tablet by mouth daily.      Zinc 50 MG TABS Take 50 mg by mouth daily.      No  facility-administered medications prior to visit.      ROS:  Review of Systems  Constitutional:  Negative for fever.  Gastrointestinal:  Negative for blood in stool, constipation, diarrhea, nausea and vomiting.  Genitourinary:  Positive for vaginal bleeding. Negative for dyspareunia, dysuria, flank pain, frequency, hematuria, urgency, vaginal discharge and vaginal pain.  Musculoskeletal:  Negative for back pain.  Skin:  Negative for rash.  BREAST: No symptoms   OBJECTIVE:   Vitals:  BP 110/64   Ht 5' 1.5" (1.562 m)   Wt 158 lb (71.7 kg)   BMI 29.37 kg/m   Physical Exam Vitals reviewed.  Constitutional:      Appearance: She is well-developed.  Pulmonary:     Effort: Pulmonary effort is normal.  Genitourinary:    General: Normal vulva.     Pubic Area: No rash.      Labia:        Right: No rash, tenderness or lesion.        Left: No rash, tenderness or lesion.      Vagina: Normal. No vaginal discharge, erythema or tenderness.     Cervix: Normal.     Uterus: Normal. Not enlarged and not tender.      Adnexa: Right adnexa normal and left adnexa normal.       Right: No mass or tenderness.         Left: No mass or tenderness.    Musculoskeletal:        General: Normal range of motion.     Cervical back: Normal range of motion.  Skin:    General: Skin is warm and dry.  Neurological:     General: No focal deficit present.     Mental Status: She is alert and oriented to person, place, and time.  Psychiatric:        Mood and Affect: Mood normal.        Behavior: Behavior normal.        Thought Content: Thought content normal.        Judgment: Judgment normal.    Assessment/Plan: PMB (postmenopausal bleeding) - Plan: Cytology - PAP, US PELVIS TRANSVAGINAL NON-OB (TV ONLY); neg exam today. Check pap and GYN u/s. Will f/u with results and mgmt. If has  thickened EM, will do EMB. May also be polyp vs atrophy.   Cervical cancer screening - Plan: Cytology - PAP  Screening  for HPV (human papillomavirus) - Plan: Cytology - PAP  History of breast cancer--discussed MyRisk testing again and pt interested. Will do when she returns for GYN u/s per her pref.      Return in about 3 weeks (around 11/29/2020) for GYN u/s for PMB at [facility], [facility] to call pt.  Meriel Kelliher B. Zerenity Bowron, PA-C 11/08/2020 11:23 AM

## 2020-11-08 NOTE — Patient Instructions (Signed)
I value your feedback and you entrusting us with your care. If you get a [facility] patient survey, I would appreciate you taking the time to let us know about your experience today. Thank you! ? ? ?

## 2020-11-09 LAB — CYTOLOGY - PAP
Comment: NEGATIVE
Diagnosis: NEGATIVE
High risk HPV: NEGATIVE

## 2020-11-13 DIAGNOSIS — Z1371 Encounter for nonprocreative screening for genetic disease carrier status: Secondary | ICD-10-CM

## 2020-11-13 HISTORY — DX: Encounter for nonprocreative screening for genetic disease carrier status: Z13.71

## 2020-11-17 ENCOUNTER — Encounter: Payer: Self-pay | Admitting: Family Medicine

## 2020-11-17 ENCOUNTER — Other Ambulatory Visit: Payer: Self-pay

## 2020-11-17 ENCOUNTER — Other Ambulatory Visit: Payer: Self-pay | Admitting: Family Medicine

## 2020-11-17 ENCOUNTER — Telehealth: Payer: Self-pay

## 2020-11-17 ENCOUNTER — Ambulatory Visit: Payer: BC Managed Care – PPO | Admitting: Family Medicine

## 2020-11-17 VITALS — BP 115/70 | HR 97 | Resp 18 | Wt 158.0 lb

## 2020-11-17 DIAGNOSIS — M546 Pain in thoracic spine: Secondary | ICD-10-CM | POA: Diagnosis not present

## 2020-11-17 DIAGNOSIS — M79671 Pain in right foot: Secondary | ICD-10-CM | POA: Diagnosis not present

## 2020-11-17 DIAGNOSIS — M7989 Other specified soft tissue disorders: Secondary | ICD-10-CM | POA: Diagnosis not present

## 2020-11-17 DIAGNOSIS — M816 Localized osteoporosis [Lequesne]: Secondary | ICD-10-CM | POA: Diagnosis not present

## 2020-11-17 DIAGNOSIS — Z111 Encounter for screening for respiratory tuberculosis: Secondary | ICD-10-CM

## 2020-11-17 NOTE — Progress Notes (Signed)
Established patient visit   Patient: Patricia Kemp   DOB: Oct 05, 1960   60 y.o. Female  MRN: [id number] Visit Date: 11/17/2020  Today's healthcare provider: Lelon Huh, MD   Chief Complaint  Patient presents with   Edema   Subjective    HPI  Edema: Patient reports having swelling in her hands and feet for the past several months. Swelling in hands has sometimes been so severe to the point where she is unable to remove her rings.  She denies a major increase in weight, shortness of breath or chest pain. Associated symptoms include mid back pain, and pain on the top of her right foot. Patient is requesting x rays.   Last CBC Lab Results  Component Value Date   WBC 6.2 10/06/2020   HGB 13.3 10/06/2020   HCT 40.4 10/06/2020   MCV 87 10/06/2020   MCH 28.5 10/06/2020   RDW 12.2 10/06/2020   PLT 222 82/57/4935   Last metabolic panel Lab Results  Component Value Date   GLUCOSE 137 (H) 10/06/2020   NA 142 10/06/2020   K 3.9 10/06/2020   CL 104 10/06/2020   CO2 23 10/06/2020   BUN 14 10/06/2020   CREATININE 0.76 10/06/2020   EGFR 89 10/06/2020   GFRNONAA 81 08/14/2019   CALCIUM 9.4 10/06/2020   PROT 6.3 10/06/2020   ALBUMIN 4.7 10/06/2020   LABGLOB 1.6 10/06/2020   AGRATIO 2.9 (H) 10/06/2020   BILITOT 0.6 10/06/2020   ALKPHOS 112 10/06/2020   AST 25 10/06/2020   ALT 22 10/06/2020         Medications: Outpatient Medications Prior to Visit  Medication Sig   alendronate (FOSAMAX) 70 MG tablet Take 1 tablet (70 mg total) by mouth once a week.   atorvastatin (LIPITOR) 10 MG tablet TAKE 1 TABLET BY MOUTH EVERY DAY   Calcium Citrate-Vitamin D (CALCIUM CITRATE + D PO) Take 2 tablets by mouth daily.    Cholecalciferol (VITAMIN D3) 125 MCG (5000 UT) TABS Take 5,000 Units by mouth daily.    DULoxetine (CYMBALTA) 30 MG capsule TAKE 1 CAPSULE BY MOUTH EVERY DAY   lidocaine-prilocaine (EMLA) cream Apply 1 application topically as needed.   Multiple Vitamins-Minerals  (WOMENS 50+ MULTI VITAMIN/MIN PO) Take 1 tablet by mouth daily.    Zinc 50 MG TABS Take 50 mg by mouth daily.    No facility-administered medications prior to visit.    Review of Systems  Constitutional:  Negative for appetite change, chills, fatigue and fever.  Respiratory:  Negative for chest tightness and shortness of breath.   Cardiovascular:  Negative for chest pain and palpitations.  Gastrointestinal:  Negative for abdominal pain, nausea and vomiting.  Musculoskeletal:  Positive for arthralgias, back pain and joint swelling.  Neurological:  Negative for dizziness and weakness.      Objective    BP 115/70 (BP Location: Left Arm, Patient Position: Sitting, Cuff Size: Normal)   Pulse 97   Resp 18   Wt 158 lb (71.7 kg)   SpO2 97% Comment: room air  BMI 29.37 kg/m  {Show previous vital signs (optional):23777}  Physical Exam   General appearance:  Well developed, well nourished female, cooperative and in no acute distress Head: Normocephalic, without obvious abnormality, atraumatic Respiratory: Respirations even and unlabored, normal respiratory rate Extremities: Slight swelling of PIPs of both hands left more than right. Slight swelling and tenderness dorsum of right foot. Tender of mid thoracic spine. No gross deformities.  Assessment & Plan     1. Swelling of left hand Primary of joints and she reports strong family history or arthritis.  - DG Hand Complete Left; Future  2. Right foot pain  - DG Foot Complete Right; Future  3. Acute midline thoracic back pain Rule out compression fracture.  - DG Thoracic Spine W/Swimmers; Future  4. Localized osteoporosis without current pathological fracture    She declined flu vaccine today.      The entirety of the information documented in the History of Present Illness, Review of Systems and Physical Exam were personally obtained by me. Portions of this information were initially documented by the CMA and reviewed by  me for thoroughness and accuracy.     Lelon Huh, MD  [facility] [phone] (phone) [phone] (fax)  [place]

## 2020-11-17 NOTE — Telephone Encounter (Signed)
Fyi: Patient was seen in the office today by Dr. Caryn Section for and acute issue. She brought in a physical form for work requesting to have it completed. Patient was recently seen by Tally Joe, FNP for a CPE. I consulted with Daneil Dan and Wendelyn Breslow about patient's request to have form completed. She also needed a TB test. Form was given to [place] and TB test was ordered under [facility]. Patient was advised that Legrand Rams would sign form once TB test results came back. Patient verbalized understanding.

## 2020-11-17 NOTE — Patient Instructions (Signed)
Please review the attached list of medications and notify my office if there are any errors.   Please go to the lab draw station in [address] on the second floor of [facility]  . Normal hours are 8:00am to 11:30am and 1:00pm to 4:00pm Monday through Friday   Go to the [facility] on [facility] for right foot, left had, and thoracic spine Xray

## 2020-11-22 ENCOUNTER — Ambulatory Visit
Admission: RE | Admit: 2020-11-22 | Discharge: 2020-11-22 | Disposition: A | Discharge status: Home / Self Care | Payer: BC Managed Care – PPO | Source: Ambulatory Visit | Attending: Family Medicine | Admitting: Family Medicine

## 2020-11-22 ENCOUNTER — Ambulatory Visit
Admission: RE | Admit: 2020-11-22 | Discharge: 2020-11-22 | Disposition: A | Discharge status: Home / Self Care | Payer: BC Managed Care – PPO | Attending: Family Medicine | Admitting: Family Medicine

## 2020-11-22 ENCOUNTER — Other Ambulatory Visit: Payer: Self-pay

## 2020-11-22 DIAGNOSIS — M47812 Spondylosis without myelopathy or radiculopathy, cervical region: Secondary | ICD-10-CM | POA: Diagnosis not present

## 2020-11-22 DIAGNOSIS — M1812 Unilateral primary osteoarthritis of first carpometacarpal joint, left hand: Secondary | ICD-10-CM | POA: Diagnosis not present

## 2020-11-22 DIAGNOSIS — M546 Pain in thoracic spine: Secondary | ICD-10-CM

## 2020-11-22 DIAGNOSIS — M79671 Pain in right foot: Secondary | ICD-10-CM | POA: Diagnosis not present

## 2020-11-22 DIAGNOSIS — M7989 Other specified soft tissue disorders: Secondary | ICD-10-CM | POA: Insufficient documentation

## 2020-11-22 DIAGNOSIS — Z111 Encounter for screening for respiratory tuberculosis: Secondary | ICD-10-CM | POA: Diagnosis not present

## 2020-11-22 DIAGNOSIS — M4184 Other forms of scoliosis, thoracic region: Secondary | ICD-10-CM | POA: Diagnosis not present

## 2020-11-22 DIAGNOSIS — M47814 Spondylosis without myelopathy or radiculopathy, thoracic region: Secondary | ICD-10-CM | POA: Diagnosis not present

## 2020-11-24 ENCOUNTER — Other Ambulatory Visit: Payer: BC Managed Care – PPO

## 2020-11-26 ENCOUNTER — Ambulatory Visit: Payer: BC Managed Care – PPO

## 2020-11-26 DIAGNOSIS — N950 Postmenopausal bleeding: Secondary | ICD-10-CM

## 2020-11-27 LAB — QUANTIFERON-TB GOLD PLUS
QuantiFERON Mitogen Value: >10 IU/mL
QuantiFERON Nil Value: 0.02 IU/mL
QuantiFERON TB1 Ag Value: 0.03 IU/mL
QuantiFERON TB2 Ag Value: 0.06 IU/mL
QuantiFERON-TB Gold Plus: NEGATIVE

## 2020-11-28 ENCOUNTER — Encounter: Payer: Self-pay | Admitting: Family Medicine

## 2020-11-30 ENCOUNTER — Other Ambulatory Visit: Payer: Self-pay

## 2020-11-30 DIAGNOSIS — M7989 Other specified soft tissue disorders: Secondary | ICD-10-CM

## 2020-11-30 DIAGNOSIS — M79671 Pain in right foot: Secondary | ICD-10-CM

## 2020-12-01 ENCOUNTER — Ambulatory Visit: Payer: BC Managed Care – PPO

## 2020-12-01 ENCOUNTER — Other Ambulatory Visit: Payer: Self-pay | Admitting: Obstetrics and Gynecology

## 2020-12-01 ENCOUNTER — Ambulatory Visit (INDEPENDENT_AMBULATORY_CARE_PROVIDER_SITE_OTHER): Payer: BC Managed Care – PPO

## 2020-12-01 ENCOUNTER — Other Ambulatory Visit: Payer: Self-pay

## 2020-12-01 ENCOUNTER — Encounter: Payer: Self-pay | Admitting: Obstetrics and Gynecology

## 2020-12-01 DIAGNOSIS — N950 Postmenopausal bleeding: Secondary | ICD-10-CM

## 2020-12-01 DIAGNOSIS — Z853 Personal history of malignant neoplasm of breast: Secondary | ICD-10-CM | POA: Diagnosis not present

## 2020-12-01 DIAGNOSIS — Z803 Family history of malignant neoplasm of breast: Secondary | ICD-10-CM | POA: Diagnosis not present

## 2020-12-06 ENCOUNTER — Ambulatory Visit: Payer: BC Managed Care – PPO | Admitting: Dermatology

## 2020-12-06 ENCOUNTER — Other Ambulatory Visit: Payer: Self-pay

## 2020-12-06 DIAGNOSIS — D229 Melanocytic nevi, unspecified: Secondary | ICD-10-CM | POA: Diagnosis not present

## 2020-12-06 DIAGNOSIS — L910 Hypertrophic scar: Secondary | ICD-10-CM | POA: Diagnosis not present

## 2020-12-06 DIAGNOSIS — Z86018 Personal history of other benign neoplasm: Secondary | ICD-10-CM

## 2020-12-06 NOTE — Patient Instructions (Signed)

## 2020-12-06 NOTE — Progress Notes (Signed)
   Follow-Up Visit   Subjective  Patricia Kemp is a 59 y.o. female who presents for the following: Follow-up.  Patient here today for 4 month follow-up and to recheck dysplastic nevi. She had shave removal x 3 on 08/25/2020 to the left calf (Mod/severe), left pretibia (Mod), and right mid sternum (Mod/severe).  The following portions of the chart were reviewed this encounter and updated as appropriate:       Review of Systems:  No other skin or systemic complaints except as noted in HPI or Assessment and Plan.  Objective  Well appearing patient in no apparent distress; mood and affect are within normal limits.  A focused examination was performed including legs, chest. Relevant physical exam findings are noted in the Assessment and Plan.  left calf, left pretibia, right mid sternum Pink biopsy sites. No recurrent pigment noted.  Scar on sternum slightly raised.  Scars on legs, slightly scaly.   Assessment & Plan  History of dysplastic nevus left calf, left pretibia, right mid sternum  With hypertrophic scar - right mid sternum  Clear with biopsies. Observe for recurrence. Call clinic for new or changing lesions.  Recommend regular skin exams, daily broad-spectrum spf 30+ sunscreen use, and photoprotection.    Melanocytic Nevi - Tan-brown and/or pink-flesh-colored symmetric macules and papules - Benign appearing on exam today - Observation - Call clinic for new or changing moles - Recommend daily use of broad spectrum spf 30+ sunscreen to sun-exposed areas.   Return in about 8 months (around 08/06/2021) for TBSE, Hx of DN.  IJamesetta Orleans, CMA, am acting as scribe for Brendolyn Patty, MD . Documentation: I have reviewed the above documentation for accuracy and completeness, and I agree with the above.  Brendolyn Patty MD

## 2020-12-10 ENCOUNTER — Encounter: Payer: Self-pay | Admitting: Family Medicine

## 2020-12-14 ENCOUNTER — Encounter: Payer: Self-pay | Admitting: Obstetrics and Gynecology

## 2020-12-20 ENCOUNTER — Encounter: Payer: Self-pay | Admitting: Obstetrics and Gynecology

## 2020-12-21 ENCOUNTER — Telehealth: Payer: Self-pay | Admitting: Obstetrics and Gynecology

## 2020-12-21 NOTE — Telephone Encounter (Signed)
Pt aware of neg MyRisk results. No TC model done given pt's hx of breast cancer.   Patient understands these results only apply to her and her children, and this is not indicative of genetic testing results of her other family members. It is recommended that her other family members have genetic testing done.  Pt also understands negative genetic testing doesn't mean she will never get any of these cancers.   Hard copy mailed to be given to pt at EMB appt tom. F/u prn.

## 2020-12-22 ENCOUNTER — Ambulatory Visit: Payer: BC Managed Care – PPO | Admitting: Obstetrics & Gynecology

## 2020-12-22 ENCOUNTER — Other Ambulatory Visit: Payer: Self-pay

## 2020-12-22 ENCOUNTER — Encounter: Payer: Self-pay | Admitting: Obstetrics & Gynecology

## 2020-12-22 VITALS — BP 130/80 | Ht 61.5 in | Wt 160.0 lb

## 2020-12-22 DIAGNOSIS — N950 Postmenopausal bleeding: Secondary | ICD-10-CM

## 2020-12-22 DIAGNOSIS — R9389 Abnormal findings on diagnostic imaging of other specified body structures: Secondary | ICD-10-CM | POA: Diagnosis not present

## 2020-12-22 NOTE — Progress Notes (Signed)
Postmenopausal Bleeding Patient is a 60 yo G1P1 WF who complains of a few episodes over the last year of vaginal bleeding. She has been menopausal for several years. Currently on no HRT and has been on Tamoxifen in past (2012-2017 approx). Bleeding is described as scant staining and has occurred  2  times in Sept; last occurrence several mos prior. Other menopausal symptoms include: none. Workup to date: pelvic ultrasound.  ES 4.24mm   PMHx: She  has a past medical history of Allergy, Arthritis, Bleeds easily ([place]), BRCA negative (11/2020), Cancer ([place]) (09/2010), Depression, Dysplastic nevus (08/25/2020), Dysplastic nevus (08/25/2020), Dysplastic nevus (08/25/2020), Dysrhythmia, Headache, Heart murmur, Osteoporosis, and Sleep apnea. Also,  has a past surgical history that includes Breast surgery (Right, 09/2010); Cesarean section; Nasal sinus surgery (2008); Breast lumpectomy (Right, 2012); Joint replacement; and Knee arthroscopy (Left, 10/29/2019)., family history includes Breast cancer (age of onset: 85) in her mother; Cancer in her paternal grandfather and paternal uncle; Colon cancer in her sister; Skin cancer in her father.,  reports that she has never smoked. She has never used smokeless tobacco. She reports current alcohol use of about 1.0 standard drink per week. She reports that she does not use drugs.  She has a current medication list which includes the following prescription(s): alendronate, atorvastatin, calcium citrate-vitamin d, vitamin d3, duloxetine, lidocaine-prilocaine, multiple vitamins-minerals, and zinc. Also, is allergic to amoxicillin.  Review of Systems  All other systems reviewed and are negative.  Objective: BP 130/80   Ht 5' 1.5" (1.562 m)   Wt 160 lb (72.6 kg)   BMI 29.74 kg/m  Physical Exam Constitutional:      General: She is not in acute distress.    Appearance: She is well-developed.  Genitourinary:     Genitourinary Comments: Difficult exam w spec due to pt  discomfort and vag narrowing     Right Labia: No rash or tenderness.    Left Labia: No tenderness or rash.    No vaginal erythema or bleeding.      Right Adnexa: not tender and no mass present.    Left Adnexa: not tender and no mass present.    No cervical motion tenderness, discharge, polyp or nabothian cyst.     Uterus is not enlarged.     No uterine mass detected.    Pelvic exam was performed with patient in the lithotomy position.  HENT:     Head: Normocephalic and atraumatic.     Nose: Nose normal.  Abdominal:     General: There is no distension.     Palpations: Abdomen is soft.     Tenderness: There is no abdominal tenderness.  Musculoskeletal:        General: Normal range of motion.  Neurological:     Mental Status: She is alert and oriented to person, place, and time.     Cranial Nerves: No cranial nerve deficit.  Skin:    General: Skin is warm and dry.  Psychiatric:        Attention and Perception: Attention normal.        Mood and Affect: Mood and affect normal.        Speech: Speech normal.        Behavior: Behavior normal.        Thought Content: Thought content normal.        Judgment: Judgment normal.    ASSESSMENT/PLAN:   Problem List Items Addressed This Visit    Visit Diagnoses     PMB (postmenopausal  bleeding)    -  Primary     Attempted EMB, not able to place speculum due to vag discomfort and narrowing.  Also noted, did not tolerate or achieve TVUS.  Discussed pros and cons of EUA, D&C.  Info given.  Consents today.  Barnett Applebaum, MD, [facility], [facility] 12/22/2020  2:07 PM

## 2020-12-22 NOTE — Progress Notes (Signed)
   PRE-OPERATIVE HISTORY AND PHYSICAL EXAM  HPI:  Patricia Kemp is a 60 y.o. G1P1001 No LMP recorded. Patient is postmenopausal.; she is being admitted for surgery related to  postmenopausal bleeding and ultrasound w slight endometrial thickening .    PMHx: Past Medical History:  Diagnosis Date   Allergy    Arthritis    Bleeds easily ([facility])    PT STATES IT TOOK HER A LITTLE LONGER TO STOP BLEEDING AFTER SINUS SURGERY   BRCA negative 11/2020   MyRisk neg except SDHA VUS   Cancer ([facility]) 09/2010   Right breast cancer   Depression    Dysplastic nevus 08/25/2020   R mid sternum, mod to severe atypia   Dysplastic nevus 08/25/2020   L pretibia, mod to severe atypia   Dysplastic nevus 08/25/2020   L calf, mod to severe atypia   Dysrhythmia    H/O IRREGULAR HEART BEAT-ASYMPTOMATIC   Headache    MIGRAINES   Heart murmur    ASYMPTOMATIC   Osteoporosis    in spine   Sleep apnea    NO CPAP-UNABLE TO USE DUE TO SINUS ISSUES   Past Surgical History:  Procedure Laterality Date   BREAST LUMPECTOMY Right 2012   hx of breast ca rad only   BREAST SURGERY Right 09/2010   CESAREAN SECTION     JOINT REPLACEMENT     RIGHT KNEE   KNEE ARTHROSCOPY Left 10/29/2019   Procedure: ARTHROSCOPY KNEE;  Surgeon: Hooten, James P, MD;  Location: [facility] ORS;  Service: Orthopedics;  Laterality: Left;   NASAL SINUS SURGERY  2008   Family History  Problem Relation Age of Onset   Breast cancer Mother 58       Breast and lung   Cancer Paternal Uncle    Cancer Paternal Grandfather        bone   Skin cancer Father        forehead   Colon cancer Sister    Social History   Tobacco Use   Smoking status: Never   Smokeless tobacco: Never  Vaping Use   Vaping Use: Never used  Substance Use Topics   Alcohol use: Yes    Alcohol/week: 1.0 standard drink    Types: 1 Cans of beer per week    Comment: rare   Drug use: Never    Current Outpatient Medications:    alendronate (FOSAMAX) 70 MG tablet, Take 1  tablet (70 mg total) by mouth once a week., Disp: 12 tablet, Rfl: 1   atorvastatin (LIPITOR) 10 MG tablet, TAKE 1 TABLET BY MOUTH EVERY DAY, Disp: 90 tablet, Rfl: 1   Calcium Citrate-Vitamin D (CALCIUM CITRATE + D PO), Take 2 tablets by mouth daily. , Disp: , Rfl:    Cholecalciferol (VITAMIN D3) 125 MCG (5000 UT) TABS, Take 5,000 Units by mouth daily. , Disp: , Rfl:    DULoxetine (CYMBALTA) 30 MG capsule, TAKE 1 CAPSULE BY MOUTH EVERY DAY, Disp: 90 capsule, Rfl: 1   lidocaine-prilocaine (EMLA) cream, Apply 1 application topically as needed., Disp: 30 g, Rfl: 0   Multiple Vitamins-Minerals (WOMENS 50+ MULTI VITAMIN/MIN PO), Take 1 tablet by mouth daily. , Disp: , Rfl:    Zinc 50 MG TABS, Take 50 mg by mouth daily. , Disp: , Rfl:  Allergies: Amoxicillin  Review of Systems  Constitutional:  Negative for chills, fever and malaise/fatigue.  HENT:  Negative for congestion, sinus pain and sore throat.   Eyes:  Negative for blurred vision and   pain.  Respiratory:  Negative for cough and wheezing.   Cardiovascular:  Negative for chest pain and leg swelling.  Gastrointestinal:  Negative for abdominal pain, constipation, diarrhea, heartburn, nausea and vomiting.  Genitourinary:  Negative for dysuria, frequency, hematuria and urgency.  Musculoskeletal:  Negative for back pain, joint pain, myalgias and neck pain.  Skin:  Negative for itching and rash.  Neurological:  Negative for dizziness, tremors and weakness.  Endo/Heme/Allergies:  Does not bruise/bleed easily.  Psychiatric/Behavioral:  Negative for depression. The patient is not nervous/anxious and does not have insomnia.    Objective: BP 130/80   Ht 5' 1.5" (1.562 m)   Wt 160 lb (72.6 kg)   BMI 29.74 kg/m   Filed Weights   12/22/20 1336  Weight: 160 lb (72.6 kg)   Physical Exam Constitutional:      General: She is not in acute distress.    Appearance: She is well-developed.  Genitourinary:     Genitourinary Comments: Narrow vagina and  discomfort w exam leading to cessation     Right Labia: No rash or tenderness.    Left Labia: No tenderness or rash.    No vaginal erythema or bleeding.      Right Adnexa: not tender and no mass present.    Left Adnexa: not tender and no mass present.    No cervical motion tenderness, discharge, polyp or nabothian cyst.     Uterus is not enlarged.     No uterine mass detected.    Pelvic exam was performed with patient in the lithotomy position.  HENT:     Head: Normocephalic and atraumatic.     Nose: Nose normal.  Abdominal:     General: There is no distension.     Palpations: Abdomen is soft.     Tenderness: There is no abdominal tenderness.  Musculoskeletal:        General: Normal range of motion.  Neurological:     Mental Status: She is alert and oriented to person, place, and time.     Cranial Nerves: No cranial nerve deficit.  Skin:    General: Skin is warm and dry.  Psychiatric:        Attention and Perception: Attention normal.        Mood and Affect: Mood and affect normal.        Speech: Speech normal.        Behavior: Behavior normal.        Thought Content: Thought content normal.        Judgment: Judgment normal.    Assessment: 1. PMB (postmenopausal bleeding)   2. Endometrial thickening on ultrasound   Plan EUA, D&C  I have had a careful discussion with this patient about all the options available and the risk/benefits of each. I have fully informed this patient that surgery may subject her to a variety of discomforts and risks: She understands that most patients have surgery with little difficulty, but problems can happen ranging from minor to fatal. These include nausea, vomiting, pain, bleeding, infection, poor healing, hernia, or formation of adhesions. Unexpected reactions may occur from any drug or anesthetic given. Unintended injury may occur to other pelvic or abdominal structures such as Fallopian tubes, ovaries, bladder, ureter (tube from kidney to  bladder), or bowel. Nerves going from the pelvis to the legs may be injured. Any such injury may require immediate or later additional surgery to correct the problem. Excessive blood loss requiring transfusion is very unlikely but possible. Dangerous   blood clots may form in the legs or lungs. Physical and sexual activity will be restricted in varying degrees for an indeterminate period of time but most often 2-6 weeks.  Finally, she understands that it is impossible to list every possible undesirable effect and that the condition for which surgery is done is not always cured or significantly improved, and in rare cases may be even worse.Ample time was given to answer all questions.  Paul Yosselin Zoeller, MD, FACOG [facility], [facility] 12/22/2020  2:12 PM  

## 2020-12-22 NOTE — H&P (View-Only) (Signed)
PRE-OPERATIVE HISTORY AND PHYSICAL EXAM  HPI:  Patricia Kemp is a 60 y.o. G1P1001 No LMP recorded. Patient is postmenopausal.; she is being admitted for surgery related to  postmenopausal bleeding and ultrasound w slight endometrial thickening .    PMHx: Past Medical History:  Diagnosis Date   Allergy    Arthritis    Bleeds easily ([place])    PT STATES IT TOOK HER A LITTLE LONGER TO STOP BLEEDING AFTER SINUS SURGERY   BRCA negative 11/2020   MyRisk neg except SDHA VUS   Cancer ([place]) 09/2010   Right breast cancer   Depression    Dysplastic nevus 08/25/2020   R mid sternum, mod to severe atypia   Dysplastic nevus 08/25/2020   L pretibia, mod to severe atypia   Dysplastic nevus 08/25/2020   L calf, mod to severe atypia   Dysrhythmia    H/O IRREGULAR HEART BEAT-ASYMPTOMATIC   Headache    MIGRAINES   Heart murmur    ASYMPTOMATIC   Osteoporosis    in spine   Sleep apnea    NO CPAP-UNABLE TO USE DUE TO SINUS ISSUES   Past Surgical History:  Procedure Laterality Date   BREAST LUMPECTOMY Right 2012   hx of breast ca rad only   BREAST SURGERY Right 09/2010   CESAREAN SECTION     JOINT REPLACEMENT     RIGHT KNEE   KNEE ARTHROSCOPY Left 10/29/2019   Procedure: ARTHROSCOPY KNEE;  Surgeon: Dereck Leep, MD;  Location: [facility] ORS;  Service: Orthopedics;  Laterality: Left;   NASAL SINUS SURGERY  2008   Family History  Problem Relation Age of Onset   Breast cancer Mother 36       Breast and lung   Cancer Paternal Uncle    Cancer Paternal Grandfather        bone   Skin cancer Father        forehead   Colon cancer Sister    Social History   Tobacco Use   Smoking status: Never   Smokeless tobacco: Never  Vaping Use   Vaping Use: Never used  Substance Use Topics   Alcohol use: Yes    Alcohol/week: 1.0 standard drink    Types: 1 Cans of beer per week    Comment: rare   Drug use: Never    Current Outpatient Medications:    alendronate (FOSAMAX) 70 MG tablet, Take 1  tablet (70 mg total) by mouth once a week., Disp: 12 tablet, Rfl: 1   atorvastatin (LIPITOR) 10 MG tablet, TAKE 1 TABLET BY MOUTH EVERY DAY, Disp: 90 tablet, Rfl: 1   Calcium Citrate-Vitamin D (CALCIUM CITRATE + D PO), Take 2 tablets by mouth daily. , Disp: , Rfl:    Cholecalciferol (VITAMIN D3) 125 MCG (5000 UT) TABS, Take 5,000 Units by mouth daily. , Disp: , Rfl:    DULoxetine (CYMBALTA) 30 MG capsule, TAKE 1 CAPSULE BY MOUTH EVERY DAY, Disp: 90 capsule, Rfl: 1   lidocaine-prilocaine (EMLA) cream, Apply 1 application topically as needed., Disp: 30 g, Rfl: 0   Multiple Vitamins-Minerals (WOMENS 50+ MULTI VITAMIN/MIN PO), Take 1 tablet by mouth daily. , Disp: , Rfl:    Zinc 50 MG TABS, Take 50 mg by mouth daily. , Disp: , Rfl:  Allergies: Amoxicillin  Review of Systems  Constitutional:  Negative for chills, fever and malaise/fatigue.  HENT:  Negative for congestion, sinus pain and sore throat.   Eyes:  Negative for blurred vision and  pain.  Respiratory:  Negative for cough and wheezing.   Cardiovascular:  Negative for chest pain and leg swelling.  Gastrointestinal:  Negative for abdominal pain, constipation, diarrhea, heartburn, nausea and vomiting.  Genitourinary:  Negative for dysuria, frequency, hematuria and urgency.  Musculoskeletal:  Negative for back pain, joint pain, myalgias and neck pain.  Skin:  Negative for itching and rash.  Neurological:  Negative for dizziness, tremors and weakness.  Endo/Heme/Allergies:  Does not bruise/bleed easily.  Psychiatric/Behavioral:  Negative for depression. The patient is not nervous/anxious and does not have insomnia.    Objective: BP 130/80   Ht 5' 1.5" (1.562 m)   Wt 160 lb (72.6 kg)   BMI 29.74 kg/m   Filed Weights   12/22/20 1336  Weight: 160 lb (72.6 kg)   Physical Exam Constitutional:      General: She is not in acute distress.    Appearance: She is well-developed.  Genitourinary:     Genitourinary Comments: Narrow vagina and  discomfort w exam leading to cessation     Right Labia: No rash or tenderness.    Left Labia: No tenderness or rash.    No vaginal erythema or bleeding.      Right Adnexa: not tender and no mass present.    Left Adnexa: not tender and no mass present.    No cervical motion tenderness, discharge, polyp or nabothian cyst.     Uterus is not enlarged.     No uterine mass detected.    Pelvic exam was performed with patient in the lithotomy position.  HENT:     Head: Normocephalic and atraumatic.     Nose: Nose normal.  Abdominal:     General: There is no distension.     Palpations: Abdomen is soft.     Tenderness: There is no abdominal tenderness.  Musculoskeletal:        General: Normal range of motion.  Neurological:     Mental Status: She is alert and oriented to person, place, and time.     Cranial Nerves: No cranial nerve deficit.  Skin:    General: Skin is warm and dry.  Psychiatric:        Attention and Perception: Attention normal.        Mood and Affect: Mood and affect normal.        Speech: Speech normal.        Behavior: Behavior normal.        Thought Content: Thought content normal.        Judgment: Judgment normal.    Assessment: 1. PMB (postmenopausal bleeding)   2. Endometrial thickening on ultrasound   Plan EUA, D&C  I have had a careful discussion with this patient about all the options available and the risk/benefits of each. I have fully informed this patient that surgery may subject her to a variety of discomforts and risks: She understands that most patients have surgery with little difficulty, but problems can happen ranging from minor to fatal. These include nausea, vomiting, pain, bleeding, infection, poor healing, hernia, or formation of adhesions. Unexpected reactions may occur from any drug or anesthetic given. Unintended injury may occur to other pelvic or abdominal structures such as Fallopian tubes, ovaries, bladder, ureter (tube from kidney to  bladder), or bowel. Nerves going from the pelvis to the legs may be injured. Any such injury may require immediate or later additional surgery to correct the problem. Excessive blood loss requiring transfusion is very unlikely but possible. Dangerous  blood clots may form in the legs or lungs. Physical and sexual activity will be restricted in varying degrees for an indeterminate period of time but most often 2-6 weeks.  Finally, she understands that it is impossible to list every possible undesirable effect and that the condition for which surgery is done is not always cured or significantly improved, and in rare cases may be even worse.Ample time was given to answer all questions.  Barnett Applebaum, MD, [facility], [facility] 12/22/2020  2:12 PM

## 2020-12-22 NOTE — Patient Instructions (Signed)
Dilation and Curettage, Care After The following information offers guidance on how to care for yourself after your procedure. Your health care provider may also give you more specific instructions. If you have problems or questions, contact your health care provider. What can I expect after the procedure? After the procedure, it is common to have: Mild pain or cramping. Some vaginal bleeding or spotting. These may last for up to 2 weeks after your procedure. Follow these instructions at home: Medicines Take over-the-counter and prescription medicines only as told by your health care provider. This is especially important if you take blood-thinning medicine. Ask your health care provider if the medicine prescribed to you: Requires you to avoid driving or using machinery. Can cause constipation. You may need to take these actions to prevent or treat constipation: Drink enough fluid to keep your urine pale yellow. Take over-the-counter or prescription medicines. Eat foods that are high in fiber, such as beans, whole grains, and fresh fruits and vegetables. Limit foods that are high in fat and processed sugars, such as fried or sweet foods. Activity  If you were given a sedative during the procedure, it can affect you for several hours. Do not drive or operate machinery until your health care provider says that it is safe. Rest as told by your health care provider. Avoid sitting for a long time without moving. Get up to take short walks every 1-2 hours. This is important to improve blood flow and breathing. Ask for help if you feel weak or unsteady. Do not lift anything that is heavier than 10 lb (4.5 kg), or the limit that you are told, until your health care provider says that it is safe. Return to your normal activities as told by your health care provider. Ask your health care provider what activities are safe for you. Lifestyle For at least 2 weeks, or as long as told by your health care  provider, do not: Douche. Use tampons. Have sex. General instructions Do not take baths, swim, or use a hot tub until your health care provider approves. Ask your health care provider if you may take showers. Do not use any products that contain nicotine or tobacco. These products include cigarettes, chewing tobacco, and vaping devices, such as e-cigarettes. These can delay healing. If you need help quitting, ask your health care provider. Wear compression stockings as told by your health care provider. These stockings help to prevent blood clots and reduce swelling in your legs. It is up to you to get the results of your procedure. Ask your health care provider, or the department that is doing the procedure, when your results will be ready. Keep all follow-up visits. This is important. Contact a health care provider if: You have severe cramps that get worse or do not get better with medicine. You have severe pain in the abdomen. You cannot drink fluids without vomiting. You develop pain in a different area of your abdomen, such as just above your thighs. You have a bad-smelling discharge from the vagina. You have a rash. Get help right away if: You have vaginal bleeding that soaks more than one sanitary pad in 1 hour, and this happens for 2 hours in a row, or you pass large clots from your vagina. You have a fever that is above 100.58F (38C). Your abdomen feels very tender or hard. You have chest pain. You have shortness of breath. You feel dizzy or light-headed, or you faint. You have pain in your neck or  shoulder area. These symptoms may represent a serious problem that is an emergency. Do not wait to see if the symptoms will go away. Get medical help right away. Call your local emergency services (911 in the [place] Do not drive yourself to the hospital. Summary After your procedure, it is common to have mild pain and bleeding or spotting that may last for up to 2 weeks. Rest as told.  Avoid sitting for a long time without moving. Get up to take short walks every 1-2 hours. Do not lift anything that is heavier than 10 lb (4.5 kg), or the limit that you are told. Keep all follow-up visits. Know the symptoms for which you should get help right away. This information is not intended to replace advice given to you by your health care provider. Make sure you discuss any questions you have with your health care provider. Document Revised: 01/21/2020 Document Reviewed: 01/21/2020 Elsevier Patient Education  2022 [employer].

## 2020-12-23 DIAGNOSIS — G4733 Obstructive sleep apnea (adult) (pediatric): Secondary | ICD-10-CM | POA: Diagnosis not present

## 2020-12-24 ENCOUNTER — Telehealth: Payer: Self-pay

## 2020-12-24 DIAGNOSIS — G4733 Obstructive sleep apnea (adult) (pediatric): Secondary | ICD-10-CM | POA: Diagnosis not present

## 2020-12-24 NOTE — Telephone Encounter (Signed)
-----   Message from Gae Dry, MD sent at 12/22/2020  1:59 PM EST ----- Regarding: [facility] Booking Request Patient Full Name:  Patricia Kemp  MRN: [id number]  DOB: 28-Apr-1960  Surgeon: Hoyt Koch, MD  Requested Surgery Date and Time: Any Primary Diagnosis AND Code: Postmenopausal bleeding Secondary Diagnosis and Code:  Surgical Procedure: Exam under anesthesia, Dilation and Curettage  RNFA Requested?: No L&D Notification: No Admission Status: same day surgery Length of Surgery: 25 min Special Case Needs: No H&P: No Phone Interview???:  Yes Interpreter: No Medical Clearance:  No Special Scheduling Instructions: No Any known health/anesthesia issues, diabetes, sleep apnea, latex allergy, defibrillator/pacemaker?: No Acuity: P2   (P1 highest, P2 delay may cause harm, P3 low, elective gyn, P4 lowest) Post op follow up visits: no

## 2020-12-24 NOTE — Telephone Encounter (Signed)
Called patient to schedule Exam under anesthesia, Dilation and Curettage w [facility]  DOS 01/13/21  H&P n/a  Pre-admit phone call appointment to be requested - date and time will be included on H&P paper work. Also all appointments will be updated on pt MyChart. Explained that this appointment has a call window. Based on the time scheduled will indicate if the call will be received within a 4 hour window before 1:00 or after.  Advised that pt may also receive calls from the hospital pharmacy and pre-service center.  Confirmed pt has [employer] as Chartered certified accountant. No secondary insurance.

## 2020-12-30 ENCOUNTER — Encounter: Payer: Self-pay | Admitting: Family Medicine

## 2020-12-30 ENCOUNTER — Other Ambulatory Visit: Payer: Self-pay

## 2020-12-30 ENCOUNTER — Telehealth (INDEPENDENT_AMBULATORY_CARE_PROVIDER_SITE_OTHER): Payer: BC Managed Care – PPO | Admitting: Family Medicine

## 2020-12-30 DIAGNOSIS — R051 Acute cough: Secondary | ICD-10-CM

## 2020-12-30 DIAGNOSIS — G44209 Tension-type headache, unspecified, not intractable: Secondary | ICD-10-CM

## 2020-12-30 DIAGNOSIS — G4733 Obstructive sleep apnea (adult) (pediatric): Secondary | ICD-10-CM | POA: Diagnosis not present

## 2020-12-30 DIAGNOSIS — J0140 Acute pansinusitis, unspecified: Secondary | ICD-10-CM | POA: Diagnosis not present

## 2020-12-30 MED ORDER — ALBUTEROL SULFATE HFA 108 (90 BASE) MCG/ACT IN AERS
2.0000 | INHALATION_SPRAY | Freq: Four times a day (QID) | RESPIRATORY_TRACT | 2 refills | Status: DC | PRN
Start: 2020-12-30 — End: 2021-01-28

## 2020-12-30 MED ORDER — GUAIFENESIN-CODEINE 100-10 MG/5ML PO SOLN
10.0000 mL | Freq: Every evening | ORAL | 0 refills | Status: DC | PRN
Start: 2020-12-30 — End: 2021-07-05

## 2020-12-30 MED ORDER — DOXYCYCLINE HYCLATE 100 MG PO TABS: 100.0000 mg | ORAL_TABLET | Freq: Two times a day (BID) | ORAL | 0 refills | Status: DC

## 2020-12-30 MED ORDER — GUAIFENESIN-DM 100-10 MG/5ML PO SYRP
10.0000 mL | ORAL_SOLUTION | ORAL | 0 refills | Status: DC | PRN
Start: 2020-12-30 — End: 2021-01-28

## 2020-12-30 MED ORDER — PREDNISONE 20 MG PO TABS: 20.0000 mg | ORAL_TABLET | Freq: Every day | ORAL | 0 refills | Status: DC

## 2020-12-30 NOTE — Assessment & Plan Note (Signed)
Ongoing cough Unable to clear with use of OTC medications Yellow/green phlegm through nose/throat

## 2020-12-30 NOTE — Assessment & Plan Note (Signed)
18+ days hx of sinus pain, ear pain, cough, head pressure, headache Failed OTC treatment

## 2020-12-30 NOTE — Progress Notes (Signed)
MyChart Video Visit    Virtual Visit via Video Note   This visit type was conducted due to national recommendations for restrictions regarding the COVID-19 Pandemic (e.g. social distancing) in an effort to limit this patient's exposure and mitigate transmission in our community. This patient is at least at moderate risk for complications without adequate follow up. This format is felt to be most appropriate for this patient at this time. Physical exam was limited by quality of the video and audio technology used for the visit.   Patient location: home bedroom Provider location: [facility]  I discussed the limitations of evaluation and management by telemedicine and the availability of in person appointments. The patient expressed understanding and agreed to proceed.  Patient: Patricia Kemp   DOB: 1960-11-07   60 y.o. Female  MRN: [id number] Visit Date: 12/30/2020  Today's healthcare provider: [name], FNP   Chief Complaint  Patient presents with   Sinus Problem   Subjective    Sinus Problem This is a new problem. The current episode started 1 to 4 weeks ago. The problem is unchanged. There has been no fever. Associated symptoms include congestion, coughing, ear pain, headaches, a hoarse voice, neck pain, sinus pressure, sneezing, a sore throat and swollen glands. Pertinent negatives include no chills, diaphoresis or shortness of breath. Past treatments include oral decongestants and acetaminophen. The treatment provided no relief.      Medications: Outpatient Medications Prior to Visit  Medication Sig   alendronate (FOSAMAX) 70 MG tablet Take 1 tablet (70 mg total) by mouth once a week.   atorvastatin (LIPITOR) 10 MG tablet TAKE 1 TABLET BY MOUTH EVERY DAY   Calcium Citrate-Vitamin D (CALCIUM CITRATE + D PO) Take 2 tablets by mouth daily. 630 mg/ 500 units   Cholecalciferol (VITAMIN D3) 125 MCG (5000 UT) TABS Take 5,000 Units by mouth daily.    DULoxetine  (CYMBALTA) 30 MG capsule TAKE 1 CAPSULE BY MOUTH EVERY DAY   lidocaine-prilocaine (EMLA) cream Apply 1 application topically as needed.   Multiple Vitamins-Minerals (WOMENS 50+ MULTI VITAMIN/MIN PO) Take 1 tablet by mouth daily.    Zinc 20 MG CAPS Take 20 mg by mouth daily.   No facility-administered medications prior to visit.    Review of Systems  Constitutional:  Negative for chills and diaphoresis.  HENT:  Positive for congestion, ear pain, hoarse voice, sinus pressure, sneezing and sore throat.   Respiratory:  Positive for cough. Negative for shortness of breath.   Musculoskeletal:  Positive for neck pain.  Neurological:  Positive for headaches.     Objective    There were no vitals taken for this visit.  Patient guided through PE via virtual call.  Physical Exam HENT:     Head: Normocephalic and atraumatic.     Jaw: No tenderness or swelling.     Salivary Glands: Right salivary gland is not diffusely enlarged or tender. Left salivary gland is not diffusely enlarged or tender.     Right Ear: Hearing normal. Tenderness present. No drainage or swelling.     Left Ear: Tenderness present. No drainage or swelling.     Nose: Congestion and rhinorrhea present. Rhinorrhea is purulent.     Right Sinus: Maxillary sinus tenderness and frontal sinus tenderness present.     Left Sinus: Maxillary sinus tenderness and frontal sinus tenderness present.     Mouth/Throat:     Mouth: Mucous membranes are moist.     Pharynx: No oropharyngeal exudate  or posterior oropharyngeal erythema.  Eyes:     General: No allergic shiner or scleral icterus.       Right eye: No discharge.        Left eye: No discharge.     Conjunctiva/sclera:     Right eye: Right conjunctiva is not injected.     Left eye: Left conjunctiva is not injected.     Pupils: Pupils are equal, round, and reactive to light.  Pulmonary:     Effort: Pulmonary effort is normal. No respiratory distress.     Breath sounds: No  wheezing.  Musculoskeletal:     Cervical back: Neck supple. Tenderness present.  Lymphadenopathy:     Cervical: Cervical adenopathy present.  Skin:    General: Skin is dry.  Neurological:     General: No focal deficit present.     Mental Status: She is alert. Mental status is at baseline.  Psychiatric:        Mood and Affect: Mood normal.        Behavior: Behavior normal.        Thought Content: Thought content normal.        Judgment: Judgment normal.       Assessment & Plan     Problem List Items Addressed This Visit       Respiratory   Acute non-recurrent pansinusitis - Primary    18+ days hx of sinus pain, ear pain, cough, head pressure, headache Failed OTC treatment      Relevant Medications   predniSONE (DELTASONE) 20 MG tablet   guaiFENesin-dextromethorphan (ROBITUSSIN DM) 100-10 MG/5ML syrup   guaiFENesin-codeine 100-10 MG/5ML syrup   doxycycline (VIBRA-TABS) 100 MG tablet   albuterol (VENTOLIN HFA) 108 (90 Base) MCG/ACT inhaler     Other   Acute cough    Ongoing cough Unable to clear with use of OTC medications Yellow/green phlegm through nose/throat      Relevant Medications   predniSONE (DELTASONE) 20 MG tablet   Acute non intractable tension-type headache    Band like pressure around front of head near sinus        Return if symptoms worsen or fail to improve.     I discussed the assessment and treatment plan with the patient. The patient was provided an opportunity to ask questions and all were answered. The patient agreed with the plan and demonstrated an understanding of the instructions.   The patient was advised to call back or seek an in-person evaluation if the symptoms worsen or if the condition fails to improve as anticipated.  I provided 9 minutes of non-face-to-face time during this encounter.  [name], FNP, have reviewed all documentation for this visit. The documentation on 12/30/20 for the exam, diagnosis, procedures,  and orders are all accurate and complete.   [name], [place] [phone] (phone) [phone] (fax)  [place]

## 2020-12-30 NOTE — Assessment & Plan Note (Signed)
Band like pressure around front of head near sinus

## 2020-12-30 NOTE — Patient Instructions (Addendum)
Take Doxy as your antibiotic 12 hrs apart from one another; recommend you pick a time that you can repeat AM or PM ex 8 AM/8PM  Codeine cough syrup for sleep to allow your chest to relax some and assist  Other cough syrup for day time  Continue increased water intake to assist with hydration and loosen mucus  Avoid earplugs, or headphone- in ear  Can use local honey to assist  FYI, prednisone best taken in morning as it can interfere with sleep as it naturally increases your heart rate and can prevent sleep

## 2021-01-04 ENCOUNTER — Other Ambulatory Visit
Admission: RE | Admit: 2021-01-04 | Discharge: 2021-01-04 | Disposition: A | Discharge status: Home / Self Care | Payer: BC Managed Care – PPO | Source: Ambulatory Visit | Attending: Obstetrics & Gynecology | Admitting: Obstetrics & Gynecology

## 2021-01-04 NOTE — Patient Instructions (Addendum)
Your procedure is scheduled on: 01/13/21 - Thursday Report to the Registration Desk on the 1st floor of the [facility]. To find out your arrival time, please call [phone] between 1PM - 3PM on: 01/12/21 - Wednesday Report to [facility] for Labs and EKG on 01/10/21 at 10:00 am.  REMEMBER: Instructions that are not followed completely may result in serious medical risk, up to and including death; or upon the discretion of your surgeon and anesthesiologist your surgery may need to be rescheduled.  Do not eat food after midnight the night before surgery.  No gum chewing, lozengers or hard candies.  You may however, drink CLEAR liquids up to 2 hours before you are scheduled to arrive for your surgery. Do not drink anything within 2 hours of your scheduled arrival time.  Clear liquids include: - water  - apple juice without pulp - gatorade (not RED, PURPLE, OR BLUE) - black coffee or tea (Do NOT add milk or creamers to the coffee or tea) Do NOT drink anything that is not on this list.  TAKE THESE MEDICATIONS THE MORNING OF SURGERY WITH A SIP OF WATER:  - DULoxetine (CYMBALTA) 30 MG capsule - atorvastatin (LIPITOR) 10 MG tablet  Use albuterol (VENTOLIN HFA) 108 (90 Base) MCG/ACT inhaler on the day of surgery and bring to the hospital.  One week prior to surgery: Stop Anti-inflammatories (NSAIDS) such as Advil, Aleve, Ibuprofen, Motrin, Naproxen, Naprosyn and Aspirin based products such as Excedrin, Goodys Powder, BC Powder.  Stop ANY OVER THE COUNTER supplements until after surgery.  You may however, continue to take Tylenol if needed for pain up until the day of surgery.  No Alcohol for 24 hours before or after surgery.  No Smoking including e-cigarettes for 24 hours prior to surgery.  No chewable tobacco products for at least 6 hours prior to surgery.  No nicotine patches on the day of surgery.  Do not use any "recreational" drugs for at least a week prior to your  surgery.  Please be advised that the combination of cocaine and anesthesia may have negative outcomes, up to and including death. If you test positive for cocaine, your surgery will be cancelled.  On the morning of surgery brush your teeth with toothpaste and water, you may rinse your mouth with mouthwash if you wish. Do not swallow any toothpaste or mouthwash.  Use CHG Soap or wipes as directed on instruction sheet.  Do not wear jewelry, make-up, hairpins, clips or nail polish.  Do not wear lotions, powders, or perfumes.   Do not shave body from the neck down 48 hours prior to surgery just in case you cut yourself which could leave a site for infection.  Also, freshly shaved skin may become irritated if using the CHG soap.  Contact lenses, hearing aids and dentures may not be worn into surgery.  Do not bring valuables to the hospital. [facility] is not responsible for any missing/lost belongings or valuables.   Notify your doctor if there is any change in your medical condition (cold, fever, infection).  Wear comfortable clothing (specific to your surgery type) to the hospital.  After surgery, you can help prevent lung complications by doing breathing exercises.  Take deep breaths and cough every 1-2 hours. Your doctor may order a device called an Incentive Spirometer to help you take deep breaths. When coughing or sneezing, hold a pillow firmly against your incision with both hands. This is called "splinting." Doing this helps protect your  incision. It also decreases belly discomfort.  If you are being admitted to the hospital overnight, leave your suitcase in the car. After surgery it may be brought to your room.  If you are being discharged the day of surgery, you will not be allowed to drive home. You will need a responsible adult (18 years or older) to drive you home and stay with you that night.   If you are taking public transportation, you will need to have a responsible  adult (18 years or older) with you. Please confirm with your physician that it is acceptable to use public transportation.   Please call the [facility] at [phone] if you have any questions about these instructions.  Surgery Visitation Policy:  Patients undergoing a surgery or procedure may have one family member or support person with them as long as that person is not COVID-19 positive or experiencing its symptoms.  That person may remain in the waiting area during the procedure and may rotate out with other people.  Inpatient Visitation:    Visiting hours are 7 a.m. to 8 p.m. Up to two visitors ages 16+ are allowed at one time in a patient room. The visitors may rotate out with other people during the day. Visitors must check out when they leave, or other visitors will not be allowed. One designated support person may remain overnight. The visitor must pass COVID-19 screenings, use hand sanitizer when entering and exiting the patient's room and wear a mask at all times, including in the patient's room. Patients must also wear a mask when staff or their visitor are in the room. Masking is required regardless of vaccination status.

## 2021-01-08 ENCOUNTER — Encounter: Payer: Self-pay | Admitting: Family Medicine

## 2021-01-08 DIAGNOSIS — F32A Depression, unspecified: Secondary | ICD-10-CM

## 2021-01-10 ENCOUNTER — Encounter: Payer: Self-pay | Admitting: Urgent Care

## 2021-01-10 ENCOUNTER — Other Ambulatory Visit
Admission: RE | Admit: 2021-01-10 | Discharge: 2021-01-10 | Disposition: A | Discharge status: Home / Self Care | Payer: BC Managed Care – PPO | Source: Ambulatory Visit | Attending: Obstetrics & Gynecology | Admitting: Obstetrics & Gynecology

## 2021-01-10 ENCOUNTER — Other Ambulatory Visit: Payer: Self-pay

## 2021-01-10 DIAGNOSIS — R9389 Abnormal findings on diagnostic imaging of other specified body structures: Secondary | ICD-10-CM

## 2021-01-10 DIAGNOSIS — N950 Postmenopausal bleeding: Secondary | ICD-10-CM

## 2021-01-10 DIAGNOSIS — G4730 Sleep apnea, unspecified: Secondary | ICD-10-CM | POA: Diagnosis not present

## 2021-01-10 DIAGNOSIS — Z01810 Encounter for preprocedural cardiovascular examination: Secondary | ICD-10-CM | POA: Diagnosis not present

## 2021-01-10 DIAGNOSIS — Z01818 Encounter for other preprocedural examination: Secondary | ICD-10-CM | POA: Insufficient documentation

## 2021-01-10 DIAGNOSIS — I498 Other specified cardiac arrhythmias: Secondary | ICD-10-CM | POA: Insufficient documentation

## 2021-01-10 DIAGNOSIS — N879 Dysplasia of cervix uteri, unspecified: Secondary | ICD-10-CM | POA: Diagnosis not present

## 2021-01-10 LAB — PROTIME-INR
INR: 1.1 (ref 0.8–1.2)
Prothrombin Time: 13.8 seconds (ref 11.4–15.2)

## 2021-01-10 LAB — TYPE AND SCREEN
ABO/RH(D): A POS
Antibody Screen: NEGATIVE

## 2021-01-10 LAB — CBC
HCT: 39.4 % (ref 36.0–46.0)
Hemoglobin: 12.8 g/dL (ref 12.0–15.0)
MCH: 28.8 pg (ref 26.0–34.0)
MCHC: 32.5 g/dL (ref 30.0–36.0)
MCV: 88.5 fL (ref 80.0–100.0)
Platelets: 204 10*3/uL (ref 150–400)
RBC: 4.45 MIL/uL (ref 3.87–5.11)
RDW: 12.7 % (ref 11.5–15.5)
WBC: 9.2 10*3/uL (ref 4.0–10.5)
nRBC: 0 % (ref 0.0–0.2)

## 2021-01-10 LAB — APTT: aPTT: 27 seconds (ref 24–36)

## 2021-01-10 MED ORDER — DULOXETINE HCL 30 MG PO CPEP: ORAL_CAPSULE | ORAL | 3 refills | Status: DC

## 2021-01-10 NOTE — Progress Notes (Signed)
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 female here for left hand swelling and right foot pain.  Symptoms developed suddenly in September with no preceding illness or medical events.  She has a prior history of bilateral knee arthritis with right knee arthroplasty and more recently left knee arthroscopy for meniscus repair last year.  But she has not experienced issues in these involved joints before.  She noticed swelling of multiple toes encompassing the entire digit.  In the left ring finger just noticed enlargement around the PIP joint.  During this time she also experienced some increase in mid back pain.  Symptoms were somewhat worst at night trying to sleep with aching at the end of the day. She was seen in October with x-rays of the involved areas checked that did not show significant bony changes correlating with her symptoms.  Symptoms improved on their own after several weeks with resolution of swelling in the toes though she reports persistence of the left ring finger changes.  While waiting after referral until this visit symptoms are now doing very well.   11/22/20 Xray left hand IMPRESSION: Mild degenerative change first carpometacarpal joint. No acute abnormality identified.  11/22/20 Xray right foot IMPRESSION: Degenerative change first MTP joint.  No acute bony abnormality.  11/22/20 Xray thoracic spine IMPRESSION: Mild scoliosis concave left with diffuse mild thoracic degenerative change. Prominent degenerative changes lower cervical spine. No acute abnormality.  Activities of Daily Living:  Patient reports morning stiffness for    none .   Patient Denies nocturnal pain.  Difficulty dressing/grooming: Denies Difficulty climbing stairs: Denies Difficulty getting out of chair: Denies Difficulty using hands for taps, buttons, cutlery, and/or writing: Denies  Review of Systems  Constitutional:  Negative for fatigue.  HENT:  Negative for mouth dryness.   Eyes:  Negative for dryness.  Respiratory:  Negative for shortness of breath.   Cardiovascular:  Negative for swelling in legs/feet.  Gastrointestinal:  Negative for constipation.  Endocrine: Negative for increased urination.  Genitourinary:  Negative for difficulty urinating.  Musculoskeletal:  Positive for gait problem and joint swelling.  Skin:  Negative for rash.  Allergic/Immunologic: Negative for susceptible to infections.  Neurological:  Negative for numbness.  Hematological:  Negative for bruising/bleeding tendency.  Psychiatric/Behavioral:  Negative for sleep disturbance.    PMFS History:  Patient Active Problem List   Diagnosis Date Noted   Swelling of left ring finger 01/11/2021   Pain in right foot 01/11/2021   Myofascial pain on right side 01/11/2021   Acute non-recurrent pansinusitis 12/30/2020   Acute cough 12/30/2020   Acute non intractable tension-type headache 12/30/2020   Routine general medical examination at health care facility 10/06/2020   Encounter for hepatitis C screening test for low risk patient 10/06/2020   Needle phobia 10/06/2020   Family history of breast cancer 06/25/2019   Osteoporosis 10/24/2018   Environmental allergies 10/24/2018   Malignant neoplasm of right female breast ([place]) 10/24/2018   Anxiety and depression 10/24/2018   Hyperlipidemia  10/24/2018    Past Medical History:  Diagnosis Date   Allergy    Arthritis    Bleeds easily ([place])    PT STATES IT TOOK HER A LITTLE LONGER TO STOP BLEEDING AFTER SINUS SURGERY   BRCA negative 11/2020   MyRisk neg except SDHA VUS   Cancer ([place]) 09/2010   Right breast cancer    Depression    Dysplastic nevus 08/25/2020   R mid sternum, mod to severe atypia   Dysplastic nevus 08/25/2020   L pretibia, mod to severe atypia   Dysplastic nevus 08/25/2020   L calf, mod to severe atypia   Dysrhythmia    H/O IRREGULAR HEART BEAT-ASYMPTOMATIC   Headache    MIGRAINES   Heart murmur    ASYMPTOMATIC   Osteoporosis    in spine   Sleep apnea    NO CPAP-UNABLE TO USE DUE TO SINUS ISSUES    Family History  Problem Relation Age of Onset   Breast cancer Mother 59       Breast and lung   Skin cancer Father        forehead   Arthritis Father    Cancer Paternal Uncle    Cancer Paternal Grandfather        bone   Past Surgical History:  Procedure Laterality Date   BREAST LUMPECTOMY Right 2012   hx of breast ca rad only   BREAST SURGERY Right 09/2010   CESAREAN SECTION     COLONOSCOPY     JOINT REPLACEMENT     RIGHT KNEE   KNEE ARTHROSCOPY Left 10/29/2019   Procedure: ARTHROSCOPY KNEE;  Surgeon: Dereck Leep, MD;  Location: [facility] ORS;  Service: Orthopedics;  Laterality: Left;   NASAL SINUS SURGERY  2008   Social History   Social History Narrative   Not on file   Immunization History  Administered Date(s) Administered   Fluad Quad(high Dose 65+) 12/18/2019   Influenza,inj,Quad PF,6+ Mos 10/23/2018   PFIZER Comirnaty(Gray Top)Covid-19 Tri-Sucrose Vaccine 07/21/2020   PFIZER(Purple Top)SARS-COV-2 Vaccination 05/05/2019, 05/26/2019, 11/28/2019   Td 08/14/2019     Objective: Vital Signs: BP 108/68 (BP Location: Left Arm, Patient Position: Sitting, Cuff Size: Normal)   Pulse 85   Resp 16   Ht 5' 1" (1.549 m)   Wt 159 lb (72.1 kg)   BMI 30.04 kg/m    Physical Exam HENT:     Right Ear: External ear normal.     Left Ear: External ear normal.     Mouth/Throat:     Mouth: Mucous membranes are moist.     Pharynx: Oropharynx is clear.  Eyes:     Conjunctiva/sclera: Conjunctivae normal.  Cardiovascular:     Rate and Rhythm: Normal rate and regular  rhythm.  Pulmonary:     Effort: Pulmonary effort is normal.     Breath sounds: Normal breath sounds.  Musculoskeletal:     Right lower leg: No edema.     Left lower leg: No edema.  Skin:    General: Skin is warm and dry.     Findings: No rash.  Neurological:     General: No focal deficit present.     Mental Status: She is alert.     Deep Tendon Reflexes: Reflexes normal.  Psychiatric:        Mood and Affect: Mood normal.     Musculoskeletal Exam:  Neck full ROM no tenderness Shoulders full ROM, palpable muscle knot in right upper back overlying scapula does not  provoke symptom radiation Elbows full ROM no tenderness or swelling Wrists full ROM no tenderness or swelling Fingers full ROM no tenderness or swelling No paraspinal tenderness to palpation over upper and lower back Hip normal internal and external rotation without pain, no tenderness to lateral hip palpation Knees full ROM no tenderness or swelling Ankles full ROM no tenderness or swelling MTPs full ROM no tenderness or swelling    Investigation: No additional findings.  Imaging: No results found.  Recent Labs: Lab Results  Component Value Date   WBC 9.2 01/10/2021   HGB 12.8 01/10/2021   PLT 204 01/10/2021   NA 142 10/06/2020   K 3.9 10/06/2020   CL 104 10/06/2020   CO2 23 10/06/2020   GLUCOSE 137 (H) 10/06/2020   BUN 14 10/06/2020   CREATININE 0.76 10/06/2020   BILITOT 0.6 10/06/2020   ALKPHOS 112 10/06/2020   AST 25 10/06/2020   ALT 22 10/06/2020   PROT 6.3 10/06/2020   ALBUMIN 4.7 10/06/2020   CALCIUM 9.4 10/06/2020   GFRAA 93 08/14/2019   QFTBGOLDPLUS Negative 11/22/2020    Speciality Comments: No specialty comments available.  Procedures:  No procedures performed Allergies: Amoxicillin   Assessment / Plan:     Visit Diagnoses: Swelling of left ring finger  Cause for the reported finger swelling and pain is unclear exam today is unremarkable range of motion is normal no tenderness  or swelling ultrasound inspection and reviewed x-ray from last month are all completely normal.  I recommended against extensive inflammatory arthritis work-up considering lack of symptoms but we can follow-up as needed to reassess if these problems recur or become more severe.  Pain in right foot  Foot pain symptoms also unclear the only appreciable osteoarthritis changes in the great toe which is not an active complaint today.  Myofascial pain on right side  At least 1 palpable myofascial bundle in the right upper back discussed continuing stretching and range of motion for the scapula and shoulder area.  Orders: No orders of the defined types were placed in this encounter.  No orders of the defined types were placed in this encounter.    Follow-Up Instructions: Return if symptoms worsen or fail to improve.   Collier Salina, MD  Note - This record has been created using [employer].  Chart creation errors have been sought, but may not always  have been located. Such creation errors do not reflect on  the standard of medical care.

## 2021-01-11 ENCOUNTER — Ambulatory Visit: Payer: BC Managed Care – PPO | Admitting: Internal Medicine

## 2021-01-11 ENCOUNTER — Encounter: Payer: Self-pay | Admitting: Internal Medicine

## 2021-01-11 DIAGNOSIS — M79671 Pain in right foot: Secondary | ICD-10-CM | POA: Diagnosis not present

## 2021-01-11 DIAGNOSIS — M7989 Other specified soft tissue disorders: Secondary | ICD-10-CM | POA: Insufficient documentation

## 2021-01-11 DIAGNOSIS — M7918 Myalgia, other site: Secondary | ICD-10-CM | POA: Insufficient documentation

## 2021-01-13 ENCOUNTER — Ambulatory Visit
Admission: RE | Admit: 2021-01-13 | Discharge: 2021-01-13 | Disposition: A | Discharge status: Home / Self Care | Payer: BC Managed Care – PPO | Source: Ambulatory Visit | Attending: Obstetrics & Gynecology | Admitting: Obstetrics & Gynecology

## 2021-01-13 ENCOUNTER — Ambulatory Visit: Payer: BC Managed Care – PPO | Admitting: Certified Registered"

## 2021-01-13 ENCOUNTER — Encounter: Payer: Self-pay | Admitting: Obstetrics & Gynecology

## 2021-01-13 ENCOUNTER — Other Ambulatory Visit: Payer: Self-pay

## 2021-01-13 ENCOUNTER — Encounter
Admission: RE | Disposition: A | Discharge status: Home / Self Care | Payer: Self-pay | Source: Ambulatory Visit | Attending: Obstetrics & Gynecology

## 2021-01-13 DIAGNOSIS — N879 Dysplasia of cervix uteri, unspecified: Secondary | ICD-10-CM | POA: Diagnosis not present

## 2021-01-13 DIAGNOSIS — N950 Postmenopausal bleeding: Secondary | ICD-10-CM | POA: Insufficient documentation

## 2021-01-13 DIAGNOSIS — G4730 Sleep apnea, unspecified: Secondary | ICD-10-CM | POA: Insufficient documentation

## 2021-01-13 DIAGNOSIS — F418 Other specified anxiety disorders: Secondary | ICD-10-CM | POA: Diagnosis not present

## 2021-01-13 HISTORY — PX: DILATION AND CURETTAGE OF UTERUS: SHX78

## 2021-01-13 SURGERY — EXAM UNDER ANESTHESIA
Anesthesia: General

## 2021-01-13 MED ORDER — LACTATED RINGERS IV SOLN: INTRAVENOUS | Status: DC

## 2021-01-13 MED ORDER — CHLORHEXIDINE GLUCONATE 0.12 % MT SOLN
15.0000 mL | Freq: Once | OROMUCOSAL | Status: AC
  Administered 2021-01-13: 15 mL via OROMUCOSAL

## 2021-01-13 MED ORDER — PROPOFOL 10 MG/ML IV BOLUS
INTRAVENOUS | Status: DC | PRN
  Administered 2021-01-13: 130 mg via INTRAVENOUS

## 2021-01-13 MED ORDER — ONDANSETRON HCL 4 MG/2ML IJ SOLN: 4.0000 mg | Freq: Once | INTRAMUSCULAR | Status: DC | PRN

## 2021-01-13 MED ORDER — CHLORHEXIDINE GLUCONATE 0.12 % MT SOLN
OROMUCOSAL | Status: AC
  Filled 2021-01-13: qty 15

## 2021-01-13 MED ORDER — ONDANSETRON HCL 4 MG/2ML IJ SOLN
INTRAMUSCULAR | Status: AC
  Filled 2021-01-13: qty 2

## 2021-01-13 MED ORDER — OXYCODONE HCL 5 MG PO TABS
ORAL_TABLET | ORAL | Status: AC
  Filled 2021-01-13: qty 1

## 2021-01-13 MED ORDER — FAMOTIDINE 20 MG PO TABS
20.0000 mg | ORAL_TABLET | Freq: Once | ORAL | Status: AC
  Administered 2021-01-13: 20 mg via ORAL

## 2021-01-13 MED ORDER — DEXAMETHASONE SODIUM PHOSPHATE 10 MG/ML IJ SOLN
INTRAMUSCULAR | Status: AC
  Filled 2021-01-13: qty 1

## 2021-01-13 MED ORDER — MIDAZOLAM HCL 2 MG/2ML IJ SOLN
INTRAMUSCULAR | Status: AC
  Filled 2021-01-13: qty 2

## 2021-01-13 MED ORDER — ORAL CARE MOUTH RINSE: 15.0000 mL | Freq: Once | OROMUCOSAL | Status: AC

## 2021-01-13 MED ORDER — KETOROLAC TROMETHAMINE 30 MG/ML IJ SOLN
INTRAMUSCULAR | Status: AC
  Filled 2021-01-13: qty 1

## 2021-01-13 MED ORDER — FAMOTIDINE 20 MG PO TABS
ORAL_TABLET | ORAL | Status: AC
  Filled 2021-01-13: qty 1

## 2021-01-13 MED ORDER — ONDANSETRON HCL 4 MG/2ML IJ SOLN
INTRAMUSCULAR | Status: DC | PRN
  Administered 2021-01-13: 4 mg via INTRAVENOUS

## 2021-01-13 MED ORDER — ACETAMINOPHEN 650 MG RE SUPP
650.0000 mg | RECTAL | Status: DC | PRN
  Filled 2021-01-13: qty 1

## 2021-01-13 MED ORDER — ACETAMINOPHEN 325 MG PO TABS: 650.0000 mg | ORAL_TABLET | ORAL | Status: DC | PRN

## 2021-01-13 MED ORDER — 0.9 % SODIUM CHLORIDE (POUR BTL) OPTIME
TOPICAL | Status: DC | PRN
  Administered 2021-01-13: 500 mL

## 2021-01-13 MED ORDER — FENTANYL CITRATE (PF) 100 MCG/2ML IJ SOLN: 25.0000 ug | INTRAMUSCULAR | Status: DC | PRN

## 2021-01-13 MED ORDER — KETOROLAC TROMETHAMINE 30 MG/ML IJ SOLN
INTRAMUSCULAR | Status: DC | PRN
  Administered 2021-01-13: 30 mg via INTRAVENOUS

## 2021-01-13 MED ORDER — FENTANYL CITRATE (PF) 100 MCG/2ML IJ SOLN
INTRAMUSCULAR | Status: AC
  Filled 2021-01-13: qty 2

## 2021-01-13 MED ORDER — LIDOCAINE HCL (PF) 2 % IJ SOLN
INTRAMUSCULAR | Status: AC
  Filled 2021-01-13: qty 5

## 2021-01-13 MED ORDER — LIDOCAINE-PRILOCAINE 2.5-2.5 % EX CREA
TOPICAL_CREAM | CUTANEOUS | Status: DC
  Filled 2021-01-13: qty 5

## 2021-01-13 MED ORDER — FENTANYL CITRATE (PF) 100 MCG/2ML IJ SOLN
INTRAMUSCULAR | Status: DC | PRN
  Administered 2021-01-13: 25 ug via INTRAVENOUS

## 2021-01-13 MED ORDER — DEXAMETHASONE SODIUM PHOSPHATE 10 MG/ML IJ SOLN
INTRAMUSCULAR | Status: DC | PRN
  Administered 2021-01-13: 10 mg via INTRAVENOUS

## 2021-01-13 MED ORDER — MIDAZOLAM HCL 2 MG/2ML IJ SOLN
INTRAMUSCULAR | Status: DC | PRN
  Administered 2021-01-13: 2 mg via INTRAVENOUS

## 2021-01-13 MED ORDER — PROPOFOL 10 MG/ML IV BOLUS
INTRAVENOUS | Status: AC
  Filled 2021-01-13: qty 20

## 2021-01-13 MED ORDER — MORPHINE SULFATE (PF) 2 MG/ML IV SOLN: 1.0000 mg | INTRAVENOUS | Status: DC | PRN

## 2021-01-13 MED ORDER — POVIDONE-IODINE 10 % EX SWAB: 2.0000 "application " | Freq: Once | CUTANEOUS | Status: DC

## 2021-01-13 MED ORDER — OXYCODONE HCL 5 MG PO TABS
5.0000 mg | ORAL_TABLET | ORAL | Status: AC
  Administered 2021-01-13: 5 mg via ORAL

## 2021-01-13 MED ORDER — LIDOCAINE HCL (CARDIAC) PF 100 MG/5ML IV SOSY
PREFILLED_SYRINGE | INTRAVENOUS | Status: DC | PRN
  Administered 2021-01-13: 60 mg via INTRAVENOUS

## 2021-01-13 SURGICAL SUPPLY — 20 items
BAG COUNTER SPONGE SURGICOUNT (BAG) ×2 IMPLANT
DRSG TELFA 3X8 NADH (GAUZE/BANDAGES/DRESSINGS) IMPLANT
GAUZE 4X4 16PLY ~~LOC~~+RFID DBL (SPONGE) ×2 IMPLANT
GLOVE SURG ENC MOIS LTX SZ8 (GLOVE) ×2 IMPLANT
GOWN STRL REUS W/ TWL LRG LVL3 (GOWN DISPOSABLE) ×1 IMPLANT
GOWN STRL REUS W/ TWL XL LVL3 (GOWN DISPOSABLE) ×1 IMPLANT
GOWN STRL REUS W/TWL LRG LVL3 (GOWN DISPOSABLE) ×1
GOWN STRL REUS W/TWL XL LVL3 (GOWN DISPOSABLE) ×1
KIT TURNOVER CYSTO (KITS) ×2 IMPLANT
MANIFOLD NEPTUNE II (INSTRUMENTS) IMPLANT
NS IRRIG 500ML POUR BTL (IV SOLUTION) ×2 IMPLANT
PACK DNC HYST (MISCELLANEOUS) ×2 IMPLANT
PAD OB MATERNITY 4.3X12.25 (PERSONAL CARE ITEMS) ×2 IMPLANT
PAD PREP 24X41 OB/GYN DISP (PERSONAL CARE ITEMS) ×2 IMPLANT
SCRUB EXIDINE 4% CHG 4OZ (MISCELLANEOUS) ×2 IMPLANT
SEAL ROD LENS SCOPE MYOSURE (ABLATOR) IMPLANT
SET CYSTO W/LG BORE CLAMP LF (SET/KITS/TRAYS/PACK) IMPLANT
STRAP SAFETY 5IN WIDE (MISCELLANEOUS) ×2 IMPLANT
TOWEL OR 17X26 4PK STRL BLUE (TOWEL DISPOSABLE) ×2 IMPLANT
WATER STERILE IRR 500ML POUR (IV SOLUTION) ×2 IMPLANT

## 2021-01-13 NOTE — Anesthesia Preprocedure Evaluation (Signed)
Anesthesia Evaluation  Patient identified by MRN, date of birth, ID band Patient awake    Reviewed: Allergy & Precautions, NPO status , Patient's Chart, lab work & pertinent test results  Airway Mallampati: II  TM Distance: >3 FB Neck ROM: Full    Dental  (+) Teeth Intact   Pulmonary neg pulmonary ROS, sleep apnea ,    Pulmonary exam normal breath sounds clear to auscultation       Cardiovascular Exercise Tolerance: Good negative cardio ROS Normal cardiovascular exam Rhythm:Regular Rate:Normal     Neuro/Psych Anxiety Depression negative neurological ROS  negative psych ROS   GI/Hepatic negative GI ROS, Neg liver ROS,   Endo/Other  negative endocrine ROS  Renal/GU negative Renal ROS  negative genitourinary   Musculoskeletal  (+) Arthritis ,   Abdominal Normal abdominal exam  (+)   Peds negative pediatric ROS (+)  Hematology negative hematology ROS (+)   Anesthesia Other Findings Past Medical History: No date: Allergy No date: Arthritis No date: Bleeds easily ([place])     Comment:  PT STATES IT TOOK HER A LITTLE LONGER TO STOP BLEEDING               AFTER SINUS SURGERY 11/2020: BRCA negative     Comment:  MyRisk neg except SDHA VUS 09/2010: Cancer ([place])     Comment:  Right breast cancer No date: Depression 08/25/2020: Dysplastic nevus     Comment:  R mid sternum, mod to severe atypia 08/25/2020: Dysplastic nevus     Comment:  L pretibia, mod to severe atypia 08/25/2020: Dysplastic nevus     Comment:  L calf, mod to severe atypia No date: Dysrhythmia     Comment:  H/O IRREGULAR HEART BEAT-ASYMPTOMATIC No date: Headache     Comment:  MIGRAINES No date: Heart murmur     Comment:  ASYMPTOMATIC No date: Osteoporosis     Comment:  in spine No date: Sleep apnea     Comment:  NO CPAP-UNABLE TO USE DUE TO SINUS ISSUES  Past Surgical History: 2012: BREAST LUMPECTOMY; Right     Comment:  hx of breast ca rad  only 09/2010: BREAST SURGERY; Right No date: CESAREAN SECTION No date: COLONOSCOPY No date: JOINT REPLACEMENT     Comment:  RIGHT KNEE 10/29/2019: KNEE ARTHROSCOPY; Left     Comment:  Procedure: ARTHROSCOPY KNEE;  Surgeon: Dereck Leep,               MD;  Location: [facility] ORS;  Service: Orthopedics;                Laterality: Left; 2008: NASAL SINUS SURGERY  BMI    Body Mass Index: 30.04 kg/m      Reproductive/Obstetrics negative OB ROS                             Anesthesia Physical Anesthesia Plan  ASA: 3  Anesthesia Plan: General   Post-op Pain Management:    Induction: Intravenous  PONV Risk Score and Plan: 1 and Ondansetron  Airway Management Planned: LMA  Additional Equipment:   Intra-op Plan:   Post-operative Plan: Extubation in OR  Informed Consent: I have reviewed the patients History and Physical, chart, labs and discussed the procedure including the risks, benefits and alternatives for the proposed anesthesia with the patient or authorized representative who has indicated his/her understanding and acceptance.     Dental Advisory Given  Plan Discussed with: CRNA  and Surgeon  Anesthesia Plan Comments:         Anesthesia Quick Evaluation

## 2021-01-13 NOTE — Anesthesia Postprocedure Evaluation (Signed)
Anesthesia Post Note  Patient: Patricia Kemp  Procedure(s) Performed: EXAM UNDER ANESTHESIA DILATATION AND CURETTAGE  Patient location during evaluation: PACU Anesthesia Type: General Level of consciousness: awake and awake and alert Pain management: satisfactory to patient Vital Signs Assessment: post-procedure vital signs reviewed and stable Respiratory status: spontaneous breathing Cardiovascular status: stable Postop Assessment: no headache Anesthetic complications: no   No notable events documented.   Last Vitals:  Vitals:   01/13/21 1203 01/13/21 1215  BP:  132/82  Pulse:  (!) 59  Resp:  15  Temp: (!) 36.1 C   SpO2:  98%    Last Pain:  Vitals:   01/13/21 1203  TempSrc:   PainSc: 0-No pain                 VAN STAVEREN,Denario Bagot

## 2021-01-13 NOTE — Transfer of Care (Signed)
Immediate Anesthesia Transfer of Care Note  Patient: Patricia Kemp  Procedure(s) Performed: EXAM UNDER ANESTHESIA DILATATION AND CURETTAGE  Patient Location: PACU  Anesthesia Type:General  Level of Consciousness: awake, alert  and drowsy  Airway & Oxygen Therapy: Patient Spontanous Breathing and Patient connected to nasal cannula oxygen  Post-op Assessment: Report given to RN and Post -op Vital signs reviewed and stable  Post vital signs: stable  Last Vitals:  Vitals Value Taken Time  BP 134/84 01/13/21 1203  Temp 36.1 C 01/13/21 1203  Pulse 59 01/13/21 1209  Resp 11 01/13/21 1209  SpO2 97 % 01/13/21 1209  Vitals shown include unvalidated device data.  Last Pain:  Vitals:   01/13/21 1203  TempSrc:   PainSc: 0-No pain         Complications: No notable events documented.

## 2021-01-13 NOTE — Discharge Instructions (Addendum)
AMBULATORY SURGERY  DISCHARGE INSTRUCTIONS   The drugs that you were given will stay in your system until tomorrow so for the next 24 hours you should not:  Drive an automobile Make any legal decisions Drink any alcoholic beverage   You may resume regular meals tomorrow.  Today it is better to start with liquids and gradually work up to solid foods.  You may eat anything you prefer, but it is better to start with liquids, then soup and crackers, and gradually work up to solid foods.   Please notify your doctor immediately if you have any unusual bleeding, trouble breathing, redness and pain at the surgery site, drainage, fever, or pain not relieved by medication.    Your post-operative visit with Dr.                                       Benedetto Goad Instructions:  Start with tylenol 1000mg  when you need it alternately with ibuprofen 800mg  every 8 hours

## 2021-01-13 NOTE — Op Note (Signed)
Operative Note  01/13/2021  PRE-OP DIAGNOSIS: Postmenopausal Bleeding  POST-OP DIAGNOSIS: Same   SURGEON: Barnett Applebaum, MD, FACOG  PROCEDURE: Procedure(s): EXAM UNDER ANESTHESIA DILATATION AND CURETTAGE   ANESTHESIA: Choice   ESTIMATED BLOOD LOSS: Min  SPECIMENS: ECC and EMC  COMPLICATIONS: None  DISPOSITION: PACU - hemodynamically stable.  CONDITION: stable  FINDINGS: Exam under anesthesia revealed small, mobile uterus with no masses and bilateral adnexa without masses or fullness.   PROCEDURE IN DETAIL: After informed consent was obtained, the patient was taken to the operating room where anesthesia was obtained without difficulty. The patient was positioned in the dorsal lithotomy position in [place]. The patient's bladder was catheterized with an in and out foley catheter. The patient was examined under anesthesia, with the above noted findings. The weighted speculum was placed inside the patient's vagina, and the the anterior lip of the cervix was seen and grasped with the tenaculum.  An endocervical curettage was performed using a kevorkian curette.  The uterine cavity was sounded to 6cm, and then the cervix was progressively dilated to a 20 French-Pratt dilator. The uterine cavity was curetted until a gritty texture was noted, yielding specimen for endometrial curettings. Excellent hemostasis was noted, and all instruments were removed, with excellent hemostasis noted throughout. She was then taken out of dorsal lithotomy. The patient tolerated the procedure well. Sponge, lap and needle counts were correct x2. The patient was taken to recovery room in excellent condition.  Barnett Applebaum, MD, [facility], [facility] 01/13/2021  11:56 AM

## 2021-01-13 NOTE — Anesthesia Procedure Notes (Signed)
Procedure Name: LMA Insertion Date/Time: 01/13/2021 11:36 AM Performed by: Natasha Mead, CRNA Pre-anesthesia Checklist: Patient identified, Emergency Drugs available, Suction available, Patient being monitored and Timeout performed Patient Re-evaluated:Patient Re-evaluated prior to induction Oxygen Delivery Method: Circle system utilized Preoxygenation: Pre-oxygenation with 100% oxygen Induction Type: IV induction Ventilation: Mask ventilation without difficulty LMA: LMA inserted LMA Size: 4.0 Number of attempts: 1 Tube secured with: Tape Dental Injury: Teeth and Oropharynx as per pre-operative assessment

## 2021-01-13 NOTE — Interval H&P Note (Signed)
History and Physical Interval Note:  01/13/2021 10:53 AM  Patricia Kemp  has presented today for surgery, with the diagnosis of Postmenopausal bleeding.  The various methods of treatment have been discussed with the patient and family. After consideration of risks, benefits and other options for treatment, the patient has consented to  Procedure(s): EXAM UNDER ANESTHESIA (N/A) DILATATION AND CURETTAGE (N/A) as a surgical intervention.  The patient's history has been reviewed, patient examined, no change in status, stable for surgery.  I have reviewed the patient's chart and labs.  Questions were answered to the patient's satisfaction.     Hoyt Koch

## 2021-01-14 LAB — SURGICAL PATHOLOGY

## 2021-01-17 DIAGNOSIS — G4733 Obstructive sleep apnea (adult) (pediatric): Secondary | ICD-10-CM | POA: Diagnosis not present

## 2021-01-26 ENCOUNTER — Ambulatory Visit (INDEPENDENT_AMBULATORY_CARE_PROVIDER_SITE_OTHER): Payer: BC Managed Care – PPO | Admitting: Obstetrics & Gynecology

## 2021-01-26 ENCOUNTER — Other Ambulatory Visit: Payer: Self-pay

## 2021-01-26 ENCOUNTER — Encounter: Payer: Self-pay | Admitting: Obstetrics & Gynecology

## 2021-01-26 ENCOUNTER — Ambulatory Visit (INDEPENDENT_AMBULATORY_CARE_PROVIDER_SITE_OTHER): Payer: BC Managed Care – PPO | Admitting: Family Medicine

## 2021-01-26 DIAGNOSIS — N950 Postmenopausal bleeding: Secondary | ICD-10-CM

## 2021-01-26 DIAGNOSIS — Z23 Encounter for immunization: Secondary | ICD-10-CM | POA: Diagnosis not present

## 2021-01-26 NOTE — Progress Notes (Signed)
Virtual Visit via Telephone Note  I connected with Patricia Kemp on 01/26/21 at  4:30 PM EST by telephone and verified that I am speaking with the correct person using two identifiers.  Location: Patient: Home Provider: Office   I discussed the limitations, risks, security and privacy concerns of performing an evaluation and management service by telephone and the availability of in person appointments. I also discussed with the patient that there may be a patient responsible charge related to this service. The patient expressed understanding and agreed to proceed.   History of Present Illness:  Postoperative Follow-up Patient presents post op from  [facility]  for  PMB , 2 weeks ago. Path: DIAGNOSIS:  A. ENDOCERVICAL CURETTINGS:  - ENDOCERVICAL GLANDULAR EPITHELIUM WITH FOCAL SQUAMOUS METAPLASIA.  - UNREMARKABLE SQUAMOUS EPITHELIUM.  - RARE STRIPS OF ATROPHIC ENDOMETRIAL GLANDULAR EPITHELIUM.  - MUCUS AND BLOOD.  - NEGATIVE FOR ATYPIA AND MALIGNANCY.   B.  ENDOMETRIAL CURETTINGS:  - FRAGMENT SUGGESTIVE OF ENDOMETRIAL POLYP.  - STRIPS OF ATROPHIC ENDOMETRIAL GLANDULAR EPITHELIUM.  - ENDOCERVICAL GLANDULAR EPITHELIUM WITH FOCAL SQUAMOUS METAPLASIA.  - UNREMARKABLE SQUAMOUS EPITHELIUM.  - NEGATIVE FOR ATYPIA/EIN AND MALIGNANCY.  Subjective: Patient reports marked improvement in her preop symptoms. Eating a regular diet without difficulty. The patient is not having any pain.  Activity: normal activities of daily living. Patient reports additional symptom's since surgery of light discharge still, but improving.   Observations/Objective: No exam today, due to telephone eVisit due to [facility] virus restriction on elective visits and procedures.  Prior visits reviewed along with ultrasounds/labs as indicated.  Assessment and Plan:   ICD-10-CM   1. PMB (postmenopausal bleeding)  N95.0     Reassured as to pathology and findings from [facility] surgery  Follow Up Instructions: With any new bleeding  episode   I discussed the assessment and treatment plan with the patient. The patient was provided an opportunity to ask questions and all were answered. The patient agreed with the plan and demonstrated an understanding of the instructions.   The patient was advised to call back or seek an in-person evaluation if the symptoms worsen or if the condition fails to improve as anticipated.  I provided 15 minutes of non-face-to-face time during this encounter.   Hoyt Koch, MD

## 2021-01-27 ENCOUNTER — Encounter: Payer: Self-pay | Admitting: Family Medicine

## 2021-01-28 ENCOUNTER — Encounter: Payer: Self-pay | Admitting: Physician Assistant

## 2021-01-28 ENCOUNTER — Other Ambulatory Visit: Payer: Self-pay

## 2021-01-28 ENCOUNTER — Ambulatory Visit: Payer: BC Managed Care – PPO | Admitting: Physician Assistant

## 2021-01-28 VITALS — BP 133/83 | HR 81 | Temp 98.5°F | Ht 62.0 in | Wt 158.6 lb

## 2021-01-28 DIAGNOSIS — J0141 Acute recurrent pansinusitis: Secondary | ICD-10-CM | POA: Diagnosis not present

## 2021-01-28 DIAGNOSIS — Z853 Personal history of malignant neoplasm of breast: Secondary | ICD-10-CM

## 2021-01-28 DIAGNOSIS — Z1239 Encounter for other screening for malignant neoplasm of breast: Secondary | ICD-10-CM | POA: Diagnosis not present

## 2021-01-28 MED ORDER — AZITHROMYCIN 250 MG PO TABS: ORAL_TABLET | ORAL | 0 refills | Status: AC

## 2021-01-28 MED ORDER — AZELASTINE HCL 0.1 % NA SOLN: 2.0000 | Freq: Two times a day (BID) | NASAL | 12 refills | Status: DC

## 2021-01-28 NOTE — Progress Notes (Signed)
Established patient visit   Patient: Patricia Kemp   DOB: 16-Sep-1960   60 y.o. Female  MRN: [id number] Visit Date: 01/28/2021  Today's healthcare provider: Mikey Kirschner, PA-C   Cc. Sinusitis, reccurrent  Subjective    HPI  Patricia Kemp is a 60 y/o female who presents today for recurrent sinus symptoms. She had a video visit 11/17 and was advised to take Doxy, codeine cough syrup for day time and to continue increasing her water intake to assist with her hydration and loosen mucus. Was also provided prednisone and told to avoid headphones or earplugs in ear. Patients reports that she is still feeling sick with symptoms of nasal discharge that is yellow color as well, with sinus congestin and pressure. Hears a popping in her ear when she blows her nose and feels it in her chest sometimes. Feels drainage down her throat. Traveling on Tuesday and would like to see if there is anything more she can do.  Denies fevers, headaches, sinus pressure, chest pain. SOB.  Hx of sinus surgery w/ no benefit. Did start CPAP machine recently.  Medications: Outpatient Medications Prior to Visit  Medication Sig   alendronate (FOSAMAX) 70 MG tablet Take 1 tablet (70 mg total) by mouth once a week.   atorvastatin (LIPITOR) 10 MG tablet TAKE 1 TABLET BY MOUTH EVERY DAY   Calcium Citrate-Vitamin D (CALCIUM CITRATE + D PO) Take 2 tablets by mouth daily. 630 mg/ 500 units   Cholecalciferol (VITAMIN D3) 125 MCG (5000 UT) TABS Take 5,000 Units by mouth daily.    doxycycline (VIBRA-TABS) 100 MG tablet Take 1 tablet (100 mg total) by mouth 2 (two) times daily.   DULoxetine (CYMBALTA) 30 MG capsule TAKE 1 CAPSULE BY MOUTH EVERY DAY   lidocaine-prilocaine (EMLA) cream Apply 1 application topically as needed.   Multiple Vitamins-Minerals (WOMENS 50+ MULTI VITAMIN/MIN PO) Take 1 tablet by mouth daily.    Zinc 20 MG CAPS Take 20 mg by mouth daily.   guaiFENesin-codeine 100-10 MG/5ML syrup Take 10 mLs by mouth at bedtime and  may repeat dose one time if needed. (Patient not taking: Reported on 01/11/2021)   [DISCONTINUED] albuterol (VENTOLIN HFA) 108 (90 Base) MCG/ACT inhaler Inhale 2 puffs into the lungs every 6 (six) hours as needed for wheezing or shortness of breath. (Patient not taking: Reported on 01/11/2021)   [DISCONTINUED] guaiFENesin-dextromethorphan (ROBITUSSIN DM) 100-10 MG/5ML syrup Take 10 mLs by mouth every 4 (four) hours as needed for cough. (Patient not taking: Reported on 01/11/2021)   [DISCONTINUED] predniSONE (DELTASONE) 20 MG tablet Take 1 tablet (20 mg total) by mouth daily with breakfast. (Patient not taking: Reported on 01/11/2021)   No facility-administered medications prior to visit.    Review of Systems  Constitutional:  Negative for fatigue and fever.  HENT:  Positive for congestion, ear pain, postnasal drip and rhinorrhea.   Respiratory:  Positive for cough. Negative for shortness of breath.   Cardiovascular:  Negative for chest pain.   Last CBC Lab Results  Component Value Date   WBC 9.2 01/10/2021   HGB 12.8 01/10/2021   HCT 39.4 01/10/2021   MCV 88.5 01/10/2021   MCH 28.8 01/10/2021   RDW 12.7 01/10/2021   PLT 204 41/74/0814   Last metabolic panel Lab Results  Component Value Date   GLUCOSE 137 (H) 10/06/2020   NA 142 10/06/2020   K 3.9 10/06/2020   CL 104 10/06/2020   CO2 23 10/06/2020   BUN 14 10/06/2020  CREATININE 0.76 10/06/2020   EGFR 89 10/06/2020   CALCIUM 9.4 10/06/2020   PROT 6.3 10/06/2020   ALBUMIN 4.7 10/06/2020   LABGLOB 1.6 10/06/2020   AGRATIO 2.9 (H) 10/06/2020   BILITOT 0.6 10/06/2020   ALKPHOS 112 10/06/2020   AST 25 10/06/2020   ALT 22 10/06/2020   Last lipids Lab Results  Component Value Date   CHOL 198 10/06/2020   HDL 61 10/06/2020   LDLCALC 111 (H) 10/06/2020   TRIG 152 (H) 10/06/2020   CHOLHDL 3.2 10/06/2020   Last hemoglobin A1c Lab Results  Component Value Date   HGBA1C 5.9 (H) 10/06/2020   Last thyroid functions Lab  Results  Component Value Date   TSH 1.920 10/06/2020   Last vitamin D Lab Results  Component Value Date   VD25OH 72.4 10/06/2020   Last vitamin B12 and Folate Lab Results  Component Value Date   VITAMINB12 643 10/06/2020       Objective    BP 133/83    Pulse 81    Temp 98.5 F (36.9 C)    Ht _0  (1.575 m)    Wt 158 lb 9.6 oz (71.9 kg)    BMI 29.01 kg/m  BP Readings from Last 3 Encounters:  01/28/21 133/83  01/13/21 140/72  01/11/21 108/68   Wt Readings from Last 3 Encounters:  01/28/21 158 lb 9.6 oz (71.9 kg)  01/13/21 159 lb (72.1 kg)  01/11/21 159 lb (72.1 kg)      Physical Exam Constitutional:      General: She is awake.     Appearance: She is well-developed.  HENT:     Head: Normocephalic.     Right Ear: Tympanic membrane normal.     Left Ear: Tympanic membrane normal.     Nose: Congestion and rhinorrhea present.     Mouth/Throat:     Mouth: Mucous membranes are dry.     Pharynx: Posterior oropharyngeal erythema present. No oropharyngeal exudate.  Eyes:     Conjunctiva/sclera: Conjunctivae normal.  Cardiovascular:     Rate and Rhythm: Normal rate and regular rhythm.     Heart sounds: Normal heart sounds.  Pulmonary:     Effort: Pulmonary effort is normal.     Breath sounds: Normal breath sounds.  Skin:    General: Skin is warm.  Neurological:     Mental Status: She is alert and oriented to person, place, and time.  Psychiatric:        Attention and Perception: Attention normal.        Mood and Affect: Mood normal.        Speech: Speech normal.        Behavior: Behavior is cooperative.     No results found for any visits on 01/28/21.  Assessment & Plan     Recurrent sinusitis Spent some timing explaining that many people find benefit from [place], which she feels usually works, secondary to antiinflammatory benefit and not antibiotic benefit. Advised she try Azelastine 2 sprays BID for a few days to see if this brings any benefit. Rx zpac as she  is traveling out of town this week, if her symptoms worsen.   Problem List Items Addressed This Visit       Respiratory   Acute non-recurrent pansinusitis - Primary   Relevant Medications   azelastine (ASTELIN) 0.1 % nasal spray   azithromycin (ZITHROMAX) 250 MG tablet   Other Visit Diagnoses     History of malignant neoplasm of right  breast       Relevant Orders   MR BREAST BILATERAL W WO CONTRAST INC CAD   Encounter for breast cancer screening using non-mammogram modality       Relevant Orders   MR BREAST BILATERAL W WO CONTRAST INC CAD     Discussed when looking at health maintenance additional screening of breast MRIs. Pt is interested due to her history of breast cancer.   Return if symptoms worsen or fail to improve.      I, Mikey Kirschner, PA-C have reviewed all documentation for this visit. The documentation on  01/28/2021 for the exam, diagnosis, procedures, and orders are all accurate and complete.    Mikey Kirschner, PA-C  [facility] [phone] (phone) [phone] (fax)  [place]

## 2021-01-31 ENCOUNTER — Ambulatory Visit: Payer: BC Managed Care – PPO | Admitting: Internal Medicine

## 2021-02-17 DIAGNOSIS — G4733 Obstructive sleep apnea (adult) (pediatric): Secondary | ICD-10-CM | POA: Diagnosis not present

## 2021-02-21 ENCOUNTER — Encounter: Payer: Self-pay | Admitting: Physician Assistant

## 2021-02-22 NOTE — Progress Notes (Signed)
Office Visit Note  Patient: Patricia Kemp             Date of Birth: 1960/06/02           MRN: [id number]             PCP: Mikey Kirschner, PA-C Referring: Mikey Kirschner, PA-C Visit Date: 02/23/2021   Subjective:  Follow-up (Right foot pain)   History of Present Illness: Patricia Kemp is a 60 y.o. female here for follow up with left hand ring finger swelling although symptoms were pretty much improved by the time of our initial visit. She did demonstrate some osteoarthritis changes particularly in knees and feet. Since our last visit she developed new pain at the right midfoot on medial side. She notices some redness and tenderness at this area. Pain is bad first thing in the morning and at night. She feels a large improvement with as needed aleve. She has not noticed recurrence of toe swelling or any change in her left hand.   Previous HPI 01/11/21 Patricia Kemp is a 61 y.o. female here for left hand swelling and right foot pain.  Symptoms developed suddenly in September with no preceding illness or medical events.  She has a prior history of bilateral knee arthritis with right knee arthroplasty and more recently left knee arthroscopy for meniscus repair last year.  But she has not experienced issues in these involved joints before.  She noticed swelling of multiple toes encompassing the entire digit.  In the left ring finger just noticed enlargement around the PIP joint.  During this time she also experienced some increase in mid back pain.  Symptoms were somewhat worst at night trying to sleep with aching at the end of the day. She was seen in October with x-rays of the involved areas checked that did not show significant bony changes correlating with her symptoms.  Symptoms improved on their own after several weeks with resolution of swelling in the toes though she reports persistence of the left ring finger changes.  While waiting after referral until this visit symptoms are now doing very  well.     Review of Systems  Constitutional:  Negative for fatigue.  HENT:  Negative for mouth dryness.   Eyes:  Negative for dryness.  Respiratory:  Negative for shortness of breath.   Cardiovascular:  Negative for swelling in legs/feet.  Gastrointestinal:  Negative for constipation.  Endocrine: Negative for excessive thirst.  Genitourinary:  Negative for difficulty urinating.  Musculoskeletal:  Positive for joint pain, joint pain and morning stiffness.  Skin:  Negative for rash.  Allergic/Immunologic: Negative for susceptible to infections.  Neurological:  Negative for numbness.  Hematological:  Negative for bruising/bleeding tendency.  Psychiatric/Behavioral:  Negative for sleep disturbance.    PMFS History:  Patient Active Problem List   Diagnosis Date Noted   Inflammatory arthritis 02/23/2021   Postmenopausal bleeding 01/13/2021   Swelling of left ring finger 01/11/2021   Pain in right foot 01/11/2021   Myofascial pain on right side 01/11/2021   Acute non-recurrent pansinusitis 12/30/2020   Acute cough 12/30/2020   Acute non intractable tension-type headache 12/30/2020   Routine general medical examination at health care facility 10/06/2020   Encounter for hepatitis C screening test for low risk patient 10/06/2020   Needle phobia 10/06/2020   Family history of breast cancer 06/25/2019   Osteoporosis 10/24/2018   Environmental allergies 10/24/2018   Malignant neoplasm of right female breast ([place]) 10/24/2018   Anxiety and depression 10/24/2018  Hyperlipidemia 10/24/2018    Past Medical History:  Diagnosis Date   Allergy    Arthritis    Bleeds easily ([place])    PT STATES IT TOOK HER A LITTLE LONGER TO STOP BLEEDING AFTER SINUS SURGERY   BRCA negative 11/2020   MyRisk neg except SDHA VUS   Cancer ([place]) 09/2010   Right breast cancer   Depression    Dysplastic nevus 08/25/2020   R mid sternum, mod to severe atypia   Dysplastic nevus 08/25/2020   L pretibia, mod to  severe atypia   Dysplastic nevus 08/25/2020   L calf, mod to severe atypia   Dysrhythmia    H/O IRREGULAR HEART BEAT-ASYMPTOMATIC   Headache    MIGRAINES   Heart murmur    ASYMPTOMATIC   Osteoporosis    in spine   Sleep apnea    NO CPAP-UNABLE TO USE DUE TO SINUS ISSUES    Family History  Problem Relation Age of Onset   Breast cancer Mother 9       Breast and lung   Skin cancer Father        forehead   Arthritis Father    Cancer Paternal Uncle    Cancer Paternal Grandfather        bone   Past Surgical History:  Procedure Laterality Date   BREAST LUMPECTOMY Right 2012   hx of breast ca rad only   BREAST SURGERY Right 09/2010   CESAREAN SECTION     COLONOSCOPY     DILATION AND CURETTAGE OF UTERUS N/A 01/13/2021   Procedure: DILATATION AND CURETTAGE;  Surgeon: Gae Dry, MD;  Location: [facility] ORS;  Service: Gynecology;  Laterality: N/A;   JOINT REPLACEMENT     RIGHT KNEE   KNEE ARTHROSCOPY Left 10/29/2019   Procedure: ARTHROSCOPY KNEE;  Surgeon: Dereck Leep, MD;  Location: [facility] ORS;  Service: Orthopedics;  Laterality: Left;   NASAL SINUS SURGERY  2008   Social History   Social History Narrative   Not on file   Immunization History  Administered Date(s) Administered   Fluad Quad(high Dose 65+) 12/18/2019   Influenza,inj,Quad PF,6+ Mos 10/23/2018, 01/26/2021   PFIZER Comirnaty(Gray Top)Covid-19 Tri-Sucrose Vaccine 07/21/2020   PFIZER(Purple Top)SARS-COV-2 Vaccination 05/05/2019, 05/26/2019, 11/28/2019   Td 08/14/2019   Zoster Recombinat (Shingrix) 01/26/2021     Objective: Vital Signs: BP 104/68 (BP Location: Left Arm, Patient Position: Sitting, Cuff Size: Normal)    Pulse 76    Resp 15    Ht $R'5\' 2"'Du$  (1.575 m)    Wt 165 lb 12.8 oz (75.2 kg)    BMI 30.33 kg/m    Physical Exam Musculoskeletal:     Right lower leg: No edema.     Left lower leg: No edema.  Skin:    General: Skin is warm and dry.     Findings: No rash.  Neurological:     General: No  focal deficit present.     Mental Status: She is alert.  Psychiatric:        Mood and Affect: Mood normal.     Musculoskeletal Exam:  Wrists full ROM no tenderness or swelling Fingers full ROM no tenderness or swelling Ankles full ROM no tenderness or swelling Very focal tenderness to pressure on dorsal side of midfoot at 1st metatarsal-1st cuneiform joint, limited ultrasound inspection indicates mild synovial thickening or swelling, no color doppler enhancement, no overlying tenosynovitis visible MTPs full ROM no tenderness or swelling  Investigation: No additional findings.  Imaging:  No results found.  Recent Labs: Lab Results  Component Value Date   WBC 9.2 01/10/2021   HGB 12.8 01/10/2021   PLT 204 01/10/2021   NA 142 10/06/2020   K 3.9 10/06/2020   CL 104 10/06/2020   CO2 23 10/06/2020   GLUCOSE 137 (H) 10/06/2020   BUN 14 10/06/2020   CREATININE 0.76 10/06/2020   BILITOT 0.6 10/06/2020   ALKPHOS 112 10/06/2020   AST 25 10/06/2020   ALT 22 10/06/2020   PROT 6.3 10/06/2020   ALBUMIN 4.7 10/06/2020   CALCIUM 9.4 10/06/2020   GFRAA 93 08/14/2019   QFTBGOLDPLUS Negative 11/22/2020    Speciality Comments: No specialty comments available.  Procedures:  No procedures performed Allergies: Amoxicillin   Assessment / Plan:     Visit Diagnoses: Pain in right foot Inflammatory arthritis  Increased right foot pain localizes at the midfoot around 1st metatarsal-cuneiform joint area with small amount of swelling on ultrasound exam no tendon changes visible. With the reported history of toe and finger swelling and now this will obtain labs working with RF, CCP, ESR, CRP. She prefers to have these drawn close to home so written orders provided. Recommended treatment with PRN NSAIDs and icing the area when at rest if having more pain or swelling.  Swelling of left ring finger  4th PIP joint unchanged from previous visit. Working up for suspected inflammatory arthritis  process as above.    Orders: No orders of the defined types were placed in this encounter.  No orders of the defined types were placed in this encounter.    Follow-Up Instructions: No follow-ups on file.   Collier Salina, MD  Note - This record has been created using [employer].  Chart creation errors have been sought, but may not always  have been located. Such creation errors do not reflect on  the standard of medical care.

## 2021-02-23 ENCOUNTER — Other Ambulatory Visit: Payer: Self-pay

## 2021-02-23 ENCOUNTER — Encounter: Payer: Self-pay | Admitting: Internal Medicine

## 2021-02-23 ENCOUNTER — Ambulatory Visit: Payer: BC Managed Care – PPO | Admitting: Internal Medicine

## 2021-02-23 VITALS — BP 104/68 | HR 76 | Resp 15 | Ht 62.0 in | Wt 165.8 lb

## 2021-02-23 DIAGNOSIS — M79671 Pain in right foot: Secondary | ICD-10-CM | POA: Diagnosis not present

## 2021-02-23 DIAGNOSIS — M1990 Unspecified osteoarthritis, unspecified site: Secondary | ICD-10-CM | POA: Insufficient documentation

## 2021-02-23 DIAGNOSIS — M7989 Other specified soft tissue disorders: Secondary | ICD-10-CM | POA: Diagnosis not present

## 2021-02-28 ENCOUNTER — Other Ambulatory Visit: Payer: Self-pay | Admitting: Family Medicine

## 2021-02-28 DIAGNOSIS — M810 Age-related osteoporosis without current pathological fracture: Secondary | ICD-10-CM

## 2021-03-07 ENCOUNTER — Encounter: Payer: Self-pay | Admitting: Physician Assistant

## 2021-03-14 DIAGNOSIS — G4733 Obstructive sleep apnea (adult) (pediatric): Secondary | ICD-10-CM | POA: Diagnosis not present

## 2021-03-20 DIAGNOSIS — G4733 Obstructive sleep apnea (adult) (pediatric): Secondary | ICD-10-CM | POA: Diagnosis not present

## 2021-03-30 ENCOUNTER — Telehealth: Payer: Self-pay

## 2021-03-30 ENCOUNTER — Ambulatory Visit (INDEPENDENT_AMBULATORY_CARE_PROVIDER_SITE_OTHER): Payer: BC Managed Care – PPO | Admitting: Family Medicine

## 2021-03-30 ENCOUNTER — Other Ambulatory Visit: Payer: Self-pay

## 2021-03-30 DIAGNOSIS — Z23 Encounter for immunization: Secondary | ICD-10-CM

## 2021-03-30 DIAGNOSIS — G4733 Obstructive sleep apnea (adult) (pediatric): Secondary | ICD-10-CM | POA: Diagnosis not present

## 2021-03-30 NOTE — Telephone Encounter (Signed)
Copied from [place] [phone]. Topic: General - Other >> Mar 30, 2021 11:56 AM McGill, Nelva Bush wrote: Reason for CRM: Pt stated she received message from Juline Patch via, MyChart but has not looked at it and would rather speak with her directly.  Pt requesting a call back.

## 2021-04-08 NOTE — Telephone Encounter (Signed)
Patient was in my office on 2/15 and we spoke in length about getting additional information to her insurance company.  I asked her once I found out if it would be okay for me to call her and leave a message.  Patient stated yes.  I did so and also sent a MyChart message as well regarding the peer to peer review.  The same day.

## 2021-04-17 DIAGNOSIS — G4733 Obstructive sleep apnea (adult) (pediatric): Secondary | ICD-10-CM | POA: Diagnosis not present

## 2021-04-22 ENCOUNTER — Other Ambulatory Visit: Payer: Self-pay

## 2021-04-22 ENCOUNTER — Ambulatory Visit
Admission: RE | Admit: 2021-04-22 | Discharge: 2021-04-22 | Disposition: A | Discharge status: Home / Self Care | Payer: BC Managed Care – PPO | Source: Ambulatory Visit | Attending: Physician Assistant | Admitting: Physician Assistant

## 2021-04-22 DIAGNOSIS — Z853 Personal history of malignant neoplasm of breast: Secondary | ICD-10-CM | POA: Diagnosis not present

## 2021-04-22 DIAGNOSIS — G4733 Obstructive sleep apnea (adult) (pediatric): Secondary | ICD-10-CM | POA: Diagnosis not present

## 2021-04-22 MED ORDER — GADOBUTROL 1 MMOL/ML IV SOLN
10.0000 mL | Freq: Once | INTRAVENOUS | Status: AC | PRN
  Administered 2021-04-22: 10 mL via INTRAVENOUS

## 2021-04-27 ENCOUNTER — Other Ambulatory Visit: Payer: Self-pay | Admitting: Family Medicine

## 2021-04-27 DIAGNOSIS — E785 Hyperlipidemia, unspecified: Secondary | ICD-10-CM

## 2021-04-27 NOTE — Telephone Encounter (Signed)
Requested Prescriptions  ?Pending Prescriptions Disp Refills  ?? atorvastatin (LIPITOR) 10 MG tablet [Pharmacy Med Name: ATORVASTATIN 10 MG TABLET] 90 tablet 1  ?  Sig: TAKE 1 TABLET BY MOUTH EVERY DAY  ?  ? Cardiovascular:  Antilipid - Statins Failed - 04/27/2021  1:59 AM  ?  ?  Failed - Lipid Panel in normal range within the last 12 months  ?  Cholesterol, Total  ?Date Value Ref Range Status  ?10/06/2020 198 100 - 199 mg/dL Final  ? ?LDL Chol Calc (NIH)  ?Date Value Ref Range Status  ?10/06/2020 111 (H) 0 - 99 mg/dL Final  ? ?HDL  ?Date Value Ref Range Status  ?10/06/2020 61 >39 mg/dL Final  ? ?Triglycerides  ?Date Value Ref Range Status  ?10/06/2020 152 (H) 0 - 149 mg/dL Final  ? ?  ?  ?  Passed - Patient is not pregnant  ?  ?  Passed - Valid encounter within last 12 months  ?  Recent Outpatient Visits   ?      ? 2 months ago Acute recurrent pansinusitis  ? [facility] Thedore Mins, Ria Comment, PA-C  ? 3 months ago Acute non-recurrent pansinusitis  ? [facility] [name], FNP  ? 5 months ago Swelling of left hand  ? [facility] Caryn Section, Kirstie Peri, MD  ? 6 months ago Routine general medical examination at health care facility  ? [facility] Tally Joe T, FNP  ? 1 year ago Allergic rhinitis due to pollen, unspecified seasonality  ? [facility] [place], [place] M, [place]  ?  ?  ?Future Appointments   ?        ? In 3 months Brendolyn Patty, MD [place]  ?  ? ?  ?  ?  ? ?

## 2021-05-13 DIAGNOSIS — G4733 Obstructive sleep apnea (adult) (pediatric): Secondary | ICD-10-CM | POA: Diagnosis not present

## 2021-05-18 DIAGNOSIS — G4733 Obstructive sleep apnea (adult) (pediatric): Secondary | ICD-10-CM | POA: Diagnosis not present

## 2021-06-06 ENCOUNTER — Encounter: Payer: Self-pay | Admitting: Physician Assistant

## 2021-06-17 DIAGNOSIS — G4733 Obstructive sleep apnea (adult) (pediatric): Secondary | ICD-10-CM | POA: Diagnosis not present

## 2021-07-04 NOTE — Progress Notes (Unsigned)
PCP: Mikey Kirschner, PA-C   No chief complaint on file.   HPI:      [name] is a 61 y.o. No obstetric history on file. whose LMP was No LMP recorded. Patient is postmenopausal., presents today for her annual examination.  Her menses are absent due to menopause. Denies PMB. She has occas vasomotor sx. S/p D&C 12/22 for PMB with Dr. Kenton Kingfisher with neg pathology.   Sex activity: currently sexually active. She does have vaginal dryness and uses lubricants with relief.  Last Pap: 06/24/18  Results were: no abnormalities /neg HPV DNA.  Hx of STDs: none  Last mammogram: 08/05/20 Results were: normal--routine follow-up in 12 months There is a FH of breast cancer in her mom. There is no FH of ovarian cancer. Sister with colon cancer. The patient does not do self-breast exams. She has a hx of RT breast cancer age 50. Did radiation and treated with tamoxifen for 5 yrs. No longer doing tx or seeing oncology. Pt had neg MyRisk testing 10/22. Had neg breast MRI 3/23.  Colonoscopy: 2018, Repeat due after 5 years. FH colon cancer in her sister.  DEXA: 2019 with osteoporosis in spine and osteopenia in hip; treating with fosamax with PCP. DEXA done 10/21 with osteopenia in spine and hip.   Tobacco use: The patient denies current or previous tobacco use. Alcohol use: social drinker  No drug use Exercise: moderately active  She does get adequate calcium and Vitamin D in her diet.  Labs with PCP.   Past Medical History:  Diagnosis Date   Allergy    Arthritis    Bleeds easily ([place])    PT STATES IT TOOK HER A LITTLE LONGER TO STOP BLEEDING AFTER SINUS SURGERY   BRCA negative 11/2020   MyRisk neg except SDHA VUS   Cancer ([place]) 09/2010   Right breast cancer   Depression    Dysplastic nevus 08/25/2020   R mid sternum, mod to severe atypia   Dysplastic nevus 08/25/2020   L pretibia, mod to severe atypia   Dysplastic nevus 08/25/2020   L calf, mod to severe atypia   Dysrhythmia    H/O  IRREGULAR HEART BEAT-ASYMPTOMATIC   Headache    MIGRAINES   Heart murmur    ASYMPTOMATIC   Osteoporosis    in spine   Sleep apnea    NO CPAP-UNABLE TO USE DUE TO SINUS ISSUES    Past Surgical History:  Procedure Laterality Date   BREAST LUMPECTOMY Right 2012   hx of breast ca rad only   BREAST SURGERY Right 09/2010   CESAREAN SECTION     COLONOSCOPY     DILATION AND CURETTAGE OF UTERUS N/A 01/13/2021   Procedure: DILATATION AND CURETTAGE;  Surgeon: Gae Dry, MD;  Location: [facility] ORS;  Service: Gynecology;  Laterality: N/A;   JOINT REPLACEMENT     RIGHT KNEE   KNEE ARTHROSCOPY Left 10/29/2019   Procedure: ARTHROSCOPY KNEE;  Surgeon: Dereck Leep, MD;  Location: [facility] ORS;  Service: Orthopedics;  Laterality: Left;   NASAL SINUS SURGERY  2008    Family History  Problem Relation Age of Onset   Breast cancer Mother 70       Breast and lung   Skin cancer Father        forehead   Arthritis Father    Cancer Paternal Uncle    Cancer Paternal Grandfather        bone    Social History  Socioeconomic History   Marital status: Married    Spouse name: Clair Gulling   Number of children: Not on file   Years of education: Not on file   Highest education level: Not on file  Occupational History   Not on file  Tobacco Use   Smoking status: Never   Smokeless tobacco: Never  Vaping Use   Vaping Use: Never used  Substance and Sexual Activity   Alcohol use: Not Currently    Comment: rare   Drug use: Never   Sexual activity: Yes  Other Topics Concern   Not on file  Social History Narrative   Not on file   Social Determinants of Health   Financial Resource Strain: Not on file  Food Insecurity: Not on file  Transportation Needs: Not on file  Physical Activity: Not on file  Stress: Not on file  Social Connections: Not on file  Intimate Partner Violence: Not on file     Current Outpatient Medications:    alendronate (FOSAMAX) 70 MG tablet, TAKE 1 TABLET (70 MG TOTAL)  BY MOUTH ONCE A WEEK., Disp: 12 tablet, Rfl: 1   atorvastatin (LIPITOR) 10 MG tablet, TAKE 1 TABLET BY MOUTH EVERY DAY, Disp: 90 tablet, Rfl: 1   azelastine (ASTELIN) 0.1 % nasal spray, Place 2 sprays into both nostrils 2 (two) times daily. Use in each nostril as directed, Disp: 30 mL, Rfl: 12   Calcium Citrate-Vitamin D (CALCIUM CITRATE + D PO), Take 2 tablets by mouth daily. 630 mg/ 500 units, Disp: , Rfl:    Cholecalciferol (VITAMIN D3) 125 MCG (5000 UT) TABS, Take 5,000 Units by mouth daily. , Disp: , Rfl:    doxycycline (VIBRA-TABS) 100 MG tablet, Take 1 tablet (100 mg total) by mouth 2 (two) times daily., Disp: 14 tablet, Rfl: 0   DULoxetine (CYMBALTA) 30 MG capsule, TAKE 1 CAPSULE BY MOUTH EVERY DAY, Disp: 90 capsule, Rfl: 3   guaiFENesin-codeine 100-10 MG/5ML syrup, Take 10 mLs by mouth at bedtime and may repeat dose one time if needed. (Patient not taking: Reported on 01/11/2021), Disp: 120 mL, Rfl: 0   lidocaine-prilocaine (EMLA) cream, Apply 1 application topically as needed., Disp: 30 g, Rfl: 0   Multiple Vitamins-Minerals (WOMENS 50+ MULTI VITAMIN/MIN PO), Take 1 tablet by mouth daily. , Disp: , Rfl:    Zinc 20 MG CAPS, Take 20 mg by mouth daily., Disp: , Rfl:      ROS:  Review of Systems  Constitutional:  Negative for fatigue, fever and unexpected weight change.  Respiratory:  Negative for cough, shortness of breath and wheezing.   Cardiovascular:  Negative for chest pain, palpitations and leg swelling.  Gastrointestinal:  Negative for blood in stool, constipation, diarrhea, nausea and vomiting.  Endocrine: Negative for cold intolerance, heat intolerance and polyuria.  Genitourinary:  Negative for dyspareunia, dysuria, flank pain, frequency, genital sores, hematuria, menstrual problem, pelvic pain, urgency, vaginal bleeding, vaginal discharge and vaginal pain.  Musculoskeletal:  Negative for arthralgias, back pain, joint swelling and myalgias.  Skin:  Negative for rash.   Neurological:  Negative for dizziness, syncope, light-headedness, numbness and headaches.  Hematological:  Negative for adenopathy.  Psychiatric/Behavioral:  Negative for agitation, confusion, sleep disturbance and suicidal ideas. The patient is not nervous/anxious.  BREAST: No symptoms    Objective: There were no vitals taken for this visit.   Physical Exam Constitutional:      Appearance: She is well-developed.  Genitourinary:     Vulva normal.     Right  Labia: No rash, tenderness or lesions.    Left Labia: No tenderness, lesions or rash.    No vaginal discharge, erythema or tenderness.      Right Adnexa: not tender and no mass present.    Left Adnexa: not tender and no mass present.    No cervical friability or polyp.     Uterus is not enlarged or tender.  Breasts:    Right: No mass, nipple discharge, skin change or tenderness.     Left: No mass, nipple discharge, skin change or tenderness.  Neck:     Thyroid: No thyromegaly.  Cardiovascular:     Rate and Rhythm: Normal rate and regular rhythm.     Heart sounds: Normal heart sounds. No murmur heard. Pulmonary:     Effort: Pulmonary effort is normal.     Breath sounds: Normal breath sounds.  Abdominal:     Palpations: Abdomen is soft.     Tenderness: There is no abdominal tenderness. There is no guarding or rebound.  Musculoskeletal:        General: Normal range of motion.     Cervical back: Normal range of motion.  Lymphadenopathy:     Cervical: No cervical adenopathy.  Neurological:     General: No focal deficit present.     Mental Status: She is alert and oriented to person, place, and time.     Cranial Nerves: No cranial nerve deficit.  Skin:    General: Skin is warm and dry.  Psychiatric:        Mood and Affect: Mood normal.        Behavior: Behavior normal.        Thought Content: Thought content normal.        Judgment: Judgment normal.  Vitals reviewed.    Assessment/Plan:  Encounter for annual  routine gynecological examination  Encounter for screening mammogram for malignant neoplasm of breast - Plan: MM 3D SCREEN BREAST BILATERAL; pt to sched mammo  Family history of breast cancer  History of breast cancer--pt qualifies for [facility] testing. Discussed today and handout given. Pt will RTO for labs--uses numbing cream before blood draws due to needle phobias.          GYN counsel breast self exam, mammography screening, menopause, adequate intake of calcium and vitamin D, diet and exercise    F/U  No follow-ups on file.  Alicia B. Copland, PA-C 07/04/2021 10:54 AM

## 2021-07-05 ENCOUNTER — Encounter: Payer: Self-pay | Admitting: Obstetrics and Gynecology

## 2021-07-05 ENCOUNTER — Other Ambulatory Visit: Payer: Self-pay

## 2021-07-05 ENCOUNTER — Telehealth: Payer: Self-pay

## 2021-07-05 ENCOUNTER — Ambulatory Visit (INDEPENDENT_AMBULATORY_CARE_PROVIDER_SITE_OTHER): Payer: BC Managed Care – PPO | Admitting: Obstetrics and Gynecology

## 2021-07-05 VITALS — BP 114/68 | Ht 61.5 in | Wt 163.0 lb

## 2021-07-05 DIAGNOSIS — Z853 Personal history of malignant neoplasm of breast: Secondary | ICD-10-CM

## 2021-07-05 DIAGNOSIS — N950 Postmenopausal bleeding: Secondary | ICD-10-CM

## 2021-07-05 DIAGNOSIS — Z1211 Encounter for screening for malignant neoplasm of colon: Secondary | ICD-10-CM

## 2021-07-05 DIAGNOSIS — Z01419 Encounter for gynecological examination (general) (routine) without abnormal findings: Secondary | ICD-10-CM | POA: Diagnosis not present

## 2021-07-05 DIAGNOSIS — Z1231 Encounter for screening mammogram for malignant neoplasm of breast: Secondary | ICD-10-CM

## 2021-07-05 MED ORDER — NA SULFATE-K SULFATE-MG SULF 17.5-3.13-1.6 GM/177ML PO SOLN
1.0000 | Freq: Once | ORAL | 0 refills | Status: AC
Start: 2021-07-05 — End: 2021-07-05

## 2021-07-05 NOTE — Telephone Encounter (Signed)
Gastroenterology Pre-Procedure Review  Request Date: 07/28/21 Requesting Physician: Dr. Vicente Males  PATIENT REVIEW QUESTIONS: The patient responded to the following health history questions as indicated:    1. Are you having any GI issues? no 2. Do you have a personal history of Polyps? no 3. Do you have a family history of Colon Cancer or Polyps? yes (sister precancerous polyps) 4. Diabetes Mellitus? no 5. Joint replacements in the past 12 months?no 6. Major health problems in the past 3 months?no 7. Any artificial heart valves, MVP, or defibrillator?no    MEDICATIONS & ALLERGIES:    Patient reports the following regarding taking any anticoagulation/antiplatelet therapy:   Plavix, Coumadin, Eliquis, Xarelto, Lovenox, Pradaxa, Brilinta, or Effient? no Aspirin? no  Patient confirms/reports the following medications:  Current Outpatient Medications  Medication Sig Dispense Refill   alendronate (FOSAMAX) 70 MG tablet TAKE 1 TABLET (70 MG TOTAL) BY MOUTH ONCE A WEEK. 12 tablet 1   atorvastatin (LIPITOR) 10 MG tablet TAKE 1 TABLET BY MOUTH EVERY DAY 90 tablet 1   Calcium Citrate-Vitamin D (CALCIUM CITRATE + D PO) Take 2 tablets by mouth daily. 630 mg/ 500 units     Cholecalciferol (VITAMIN D3) 125 MCG (5000 UT) TABS Take 5,000 Units by mouth daily.      lidocaine-prilocaine (EMLA) cream Apply 1 application topically as needed. 30 g 0   Multiple Vitamins-Minerals (WOMENS 50+ MULTI VITAMIN/MIN PO) Take 1 tablet by mouth daily.      Zinc 20 MG CAPS Take 20 mg by mouth daily.     No current facility-administered medications for this visit.    Patient confirms/reports the following allergies:  Allergies  Allergen Reactions   Amoxicillin Nausea Only    No orders of the defined types were placed in this encounter.   AUTHORIZATION INFORMATION Primary Insurance: 1D#: Group #:  Secondary Insurance: 1D#: Group #:  SCHEDULE INFORMATION: Date: 07/28/21 Time: Location: [place]

## 2021-07-05 NOTE — Patient Instructions (Addendum)
I value your feedback and you entrusting us with your care. If you get a [facility] patient survey, I would appreciate you taking the time to let us know about your experience today. Thank you!  [facility]: [phone]      

## 2021-07-18 DIAGNOSIS — G4733 Obstructive sleep apnea (adult) (pediatric): Secondary | ICD-10-CM | POA: Diagnosis not present

## 2021-07-28 ENCOUNTER — Ambulatory Visit: Payer: BC Managed Care – PPO | Admitting: Anesthesiology

## 2021-07-28 ENCOUNTER — Other Ambulatory Visit: Payer: Self-pay

## 2021-07-28 ENCOUNTER — Encounter
Admission: RE | Disposition: A | Discharge status: Home / Self Care | Payer: Self-pay | Source: Home / Self Care | Attending: Gastroenterology

## 2021-07-28 ENCOUNTER — Ambulatory Visit
Admission: RE | Admit: 2021-07-28 | Discharge: 2021-07-28 | Disposition: A | Discharge status: Home / Self Care | Payer: BC Managed Care – PPO | Attending: Gastroenterology | Admitting: Gastroenterology

## 2021-07-28 ENCOUNTER — Encounter: Payer: Self-pay | Admitting: Gastroenterology

## 2021-07-28 DIAGNOSIS — F418 Other specified anxiety disorders: Secondary | ICD-10-CM | POA: Insufficient documentation

## 2021-07-28 DIAGNOSIS — E669 Obesity, unspecified: Secondary | ICD-10-CM | POA: Diagnosis not present

## 2021-07-28 DIAGNOSIS — K635 Polyp of colon: Secondary | ICD-10-CM | POA: Diagnosis not present

## 2021-07-28 DIAGNOSIS — Z853 Personal history of malignant neoplasm of breast: Secondary | ICD-10-CM | POA: Diagnosis not present

## 2021-07-28 DIAGNOSIS — Z1211 Encounter for screening for malignant neoplasm of colon: Secondary | ICD-10-CM

## 2021-07-28 DIAGNOSIS — D122 Benign neoplasm of ascending colon: Secondary | ICD-10-CM | POA: Diagnosis not present

## 2021-07-28 DIAGNOSIS — Z6831 Body mass index (BMI) 31.0-31.9, adult: Secondary | ICD-10-CM | POA: Diagnosis not present

## 2021-07-28 HISTORY — DX: Hyperlipidemia, unspecified: E78.5

## 2021-07-28 HISTORY — PX: COLONOSCOPY WITH PROPOFOL: SHX5780

## 2021-07-28 SURGERY — COLONOSCOPY WITH PROPOFOL
Anesthesia: General

## 2021-07-28 MED ORDER — SODIUM CHLORIDE 0.9 % IV SOLN: INTRAVENOUS | Status: DC

## 2021-07-28 MED ORDER — PROPOFOL 10 MG/ML IV BOLUS
INTRAVENOUS | Status: DC | PRN
  Administered 2021-07-28: 60 mg via INTRAVENOUS

## 2021-07-28 MED ORDER — LIDOCAINE HCL (CARDIAC) PF 100 MG/5ML IV SOSY
PREFILLED_SYRINGE | INTRAVENOUS | Status: DC | PRN
  Administered 2021-07-28: 40 mg via INTRAVENOUS

## 2021-07-28 MED ORDER — PROPOFOL 500 MG/50ML IV EMUL
INTRAVENOUS | Status: DC | PRN
  Administered 2021-07-28: 125 ug/kg/min via INTRAVENOUS

## 2021-07-28 NOTE — Anesthesia Preprocedure Evaluation (Addendum)
Anesthesia Evaluation  Patient identified by MRN, date of birth, ID band Patient awake    Reviewed: Allergy & Precautions, NPO status , Patient's Chart, lab work & pertinent test results  History of Anesthesia Complications Negative for: history of anesthetic complications  Airway Mallampati: I   Neck ROM: Full    Dental   Crowns :   Pulmonary sleep apnea ,    Pulmonary exam normal breath sounds clear to auscultation       Cardiovascular Exercise Tolerance: Good Normal cardiovascular exam Rhythm:Regular Rate:Normal  ECG 01/14/21: NSR, nonspecific ST abnormality   Neuro/Psych  Headaches, PSYCHIATRIC DISORDERS Anxiety Depression    GI/Hepatic negative GI ROS,   Endo/Other  Obesity   Renal/GU negative Renal ROS     Musculoskeletal   Abdominal   Peds  Hematology Breast CA   Anesthesia Other Findings   Reproductive/Obstetrics                            Anesthesia Physical Anesthesia Plan  ASA: 2  Anesthesia Plan: General   Post-op Pain Management:    Induction: Intravenous  PONV Risk Score and Plan: 3 and Propofol infusion, TIVA and Treatment may vary due to age or medical condition  Airway Management Planned: Natural Airway  Additional Equipment:   Intra-op Plan:   Post-operative Plan:   Informed Consent: I have reviewed the patients History and Physical, chart, labs and discussed the procedure including the risks, benefits and alternatives for the proposed anesthesia with the patient or authorized representative who has indicated his/her understanding and acceptance.       Plan Discussed with: CRNA  Anesthesia Plan Comments: (LMA/GETA backup discussed.  Patient consented for risks of anesthesia including but not limited to:  - adverse reactions to medications - damage to eyes, teeth, lips or other oral mucosa - nerve damage due to positioning  - sore throat or  hoarseness - damage to heart, brain, nerves, lungs, other parts of body or loss of life  Informed patient about role of CRNA in peri- and intra-operative care.  Patient voiced understanding.)       Anesthesia Quick Evaluation

## 2021-07-28 NOTE — Anesthesia Postprocedure Evaluation (Signed)
Anesthesia Post Note  Patient: Patricia Kemp  Procedure(s) Performed: COLONOSCOPY WITH PROPOFOL  Patient location during evaluation: Endoscopy Anesthesia Type: General Level of consciousness: awake and alert Pain management: pain level controlled Vital Signs Assessment: post-procedure vital signs reviewed and stable Respiratory status: spontaneous breathing, nonlabored ventilation, respiratory function stable and patient connected to nasal cannula oxygen Cardiovascular status: blood pressure returned to baseline and stable Postop Assessment: no apparent nausea or vomiting Anesthetic complications: no   No notable events documented.   Last Vitals:  Vitals:   07/28/21 1025 07/28/21 1045  BP: 114/66 133/83  Pulse: 93   Resp: (!) 22   Temp: (!) 36.3 C   SpO2: 99%     Last Pain:  Vitals:   07/28/21 1045  TempSrc:   PainSc: 0-No pain                 Martha Clan

## 2021-07-28 NOTE — H&P (Signed)
   Kiran Anna, MD [address], [address], [place], [place], [address] [address], [address], [place], [place], [address] Phone: [phone]  Fax: [phone]  Primary Care Physician:  Drubel, Lindsay, PA-C   Pre-Procedure History & Physical: HPI:  Patricia Kemp is a 61 y.o. female is here for an colonoscopy.   Past Medical History:  Diagnosis Date   Allergy    Arthritis    Bleeds easily ([facility])    PT STATES IT TOOK HER A LITTLE LONGER TO STOP BLEEDING AFTER SINUS SURGERY   BRCA negative 11/2020   MyRisk neg except SDHA VUS   Cancer ([facility]) 09/2010   Right breast cancer   Depression    Dysplastic nevus 08/25/2020   R mid sternum, mod to severe atypia   Dysplastic nevus 08/25/2020   L pretibia, mod to severe atypia   Dysplastic nevus 08/25/2020   L calf, mod to severe atypia   Dysrhythmia    H/O IRREGULAR HEART BEAT-ASYMPTOMATIC   Headache    MIGRAINES   Heart murmur    ASYMPTOMATIC   HLD (hyperlipidemia)    Osteoporosis    in spine   Sleep apnea    NO CPAP-UNABLE TO USE DUE TO SINUS ISSUES    Past Surgical History:  Procedure Laterality Date   BREAST LUMPECTOMY Right 2012   hx of breast ca rad only   BREAST SURGERY Right 09/2010   CESAREAN SECTION     COLONOSCOPY     DILATION AND CURETTAGE OF UTERUS N/A 01/13/2021   Procedure: DILATATION AND CURETTAGE;  Surgeon: Harris, Robert P, MD;  Location: [facility] ORS;  Service: Gynecology;  Laterality: N/A;   JOINT REPLACEMENT     RIGHT KNEE   KNEE ARTHROSCOPY Left 10/29/2019   Procedure: ARTHROSCOPY KNEE;  Surgeon: Hooten, James P, MD;  Location: [facility] ORS;  Service: Orthopedics;  Laterality: Left;   NASAL SINUS SURGERY  2008    Prior to Admission medications   Medication Sig Start Date End Date Taking? Authorizing Provider  atorvastatin (LIPITOR) 10 MG tablet TAKE 1 TABLET BY MOUTH EVERY DAY 04/27/21  Yes Drubel, Lindsay, PA-C  FLUoxetine HCl (PROZAC PO) Take by mouth.   Yes [provider]  alendronate  (FOSAMAX) 70 MG tablet TAKE 1 TABLET (70 MG TOTAL) BY MOUTH ONCE A WEEK. 02/28/21 08/27/21  Drubel, Lindsay, PA-C  Calcium Citrate-Vitamin D (CALCIUM CITRATE + D PO) Take 2 tablets by mouth daily. 630 mg/ 500 units    [provider]  Cholecalciferol (VITAMIN D3) 125 MCG (5000 UT) TABS Take 5,000 Units by mouth daily.     [provider]  lidocaine-prilocaine (EMLA) cream Apply 1 application topically as needed. 10/06/20   Payne, Elise T, FNP  Multiple Vitamins-Minerals (WOMENS 50+ MULTI VITAMIN/MIN PO) Take 1 tablet by mouth daily.     [provider]  Zinc 20 MG CAPS Take 20 mg by mouth daily.    [provider]    Allergies as of 07/05/2021 - Review Complete 07/05/2021  Allergen Reaction Noted   Amoxicillin Nausea Only 10/23/2018    Family History  Problem Relation Age of Onset   Breast cancer Mother 58       Breast and lung   Skin cancer Father        forehead   Arthritis Father    Cancer Paternal Uncle    Cancer Paternal Grandfather        bone    Social History   Socioeconomic History     Marital status: Married    Spouse name: Clair Gulling   Number of children: Not on file   Years of education: Not on file   Highest education level: Not on file  Occupational History   Not on file  Tobacco Use   Smoking status: Never   Smokeless tobacco: Never  Vaping Use   Vaping Use: Never used  Substance and Sexual Activity   Alcohol use: Not Currently    Comment: rare   Drug use: Never   Sexual activity: Yes  Other Topics Concern   Not on file  Social History Narrative   Not on file   Social Determinants of Health   Financial Resource Strain: Not on file  Food Insecurity: Not on file  Transportation Needs: Not on file  Physical Activity: Not on file  Stress: Not on file  Social Connections: Not on file  Intimate Partner Violence: Not on file    Review of Systems: See HPI, otherwise negative ROS  Physical Exam: BP (!) 135/91   Pulse 84    Temp 97.8 F (36.6 C) (Temporal)   Resp 16   Ht 5' 1" (1.549 m)   Wt 74.8 kg   SpO2 97%   BMI 31.18 kg/m  General:   Alert,  pleasant and cooperative in NAD Head:  Normocephalic and atraumatic. Neck:  Supple; no masses or thyromegaly. Lungs:  Clear throughout to auscultation, normal respiratory effort.    Heart:  +S1, +S2, Regular rate and rhythm, No edema. Abdomen:  Soft, nontender and nondistended. Normal bowel sounds, without guarding, and without rebound.   Neurologic:  Alert and  oriented x4;  grossly normal neurologically.  Impression/Plan: Patricia Kemp is here for an colonoscopy to be performed for Screening colonoscopy average risk   Risks, benefits, limitations, and alternatives regarding  colonoscopy have been reviewed with the patient.  Questions have been answered.  All parties agreeable.   Jonathon Bellows, MD  07/28/2021, 9:56 AM

## 2021-07-28 NOTE — Transfer of Care (Signed)
Immediate Anesthesia Transfer of Care Note  Patient: Patricia Kemp  Procedure(s) Performed: COLONOSCOPY WITH PROPOFOL  Patient Location: PACU  Anesthesia Type:General  Level of Consciousness: awake, alert  and oriented  Airway & Oxygen Therapy: Patient Spontanous Breathing  Post-op Assessment: Report given to RN and Post -op Vital signs reviewed and stable  Post vital signs: Reviewed and stable  Last Vitals:  Vitals Value Taken Time  BP    Temp    Pulse 93 07/28/21 1026  Resp 22 07/28/21 1026  SpO2 99 % 07/28/21 1026  Vitals shown include unvalidated device data.  Last Pain:  Vitals:   07/28/21 0848  TempSrc: Temporal  PainSc: 0-No pain         Complications: No notable events documented.

## 2021-07-28 NOTE — Op Note (Signed)
[facility] Gastroenterology Patient Name: Patricia Kemp Procedure Date: 07/28/2021 9:52 AM MRN: [id number] Account #: [id number] Date of Birth: Oct 17, 1960 Admit Type: Outpatient Age: 61 Room: [facility] ENDO ROOM 3 Gender: Female Note Status: Finalized Instrument Name: Park Meo [id number] Procedure:             Colonoscopy Indications:           Screening for colorectal malignant neoplasm Providers:             Jonathon Bellows MD, MD Referring MD:          Mikey Kirschner PA Medicines:             Monitored Anesthesia Care Complications:         No immediate complications. Procedure:             Pre-Anesthesia Assessment:                        - Prior to the procedure, a History and Physical was                         performed, and patient medications, allergies and                         sensitivities were reviewed. The patient's tolerance                         of previous anesthesia was reviewed.                        - The risks and benefits of the procedure and the                         sedation options and risks were discussed with the                         patient. All questions were answered and informed                         consent was obtained.                        - After reviewing the risks and benefits, the patient                         was deemed in satisfactory condition to undergo the                         procedure.                        - ASA Grade Assessment: II - A patient with mild                         systemic disease.                        After obtaining informed consent, the colonoscope was                         passed under direct vision. Throughout the procedure,  the patient's blood pressure, pulse, and oxygen                         saturations were monitored continuously. The                         Colonoscope was introduced through the anus and                         advanced to the the cecum,  identified by the                         appendiceal orifice. The colonoscopy was performed                         with ease. The patient tolerated the procedure well.                         The quality of the bowel preparation was excellent. Findings:      The perianal and digital rectal examinations were normal.      Two sessile polyps were found in the ascending colon. The polyps were 4       to 6 mm in size. These polyps were removed with a cold snare. Resection       and retrieval were complete.      The exam was otherwise without abnormality on direct and retroflexion       views. Impression:            - Two 4 to 6 mm polyps in the ascending colon, removed                         with a cold snare. Resected and retrieved.                        - The examination was otherwise normal on direct and                         retroflexion views. Recommendation:        - Discharge patient to home (with escort).                        - Resume previous diet.                        - Continue present medications.                        - Await pathology results.                        - Repeat colonoscopy for surveillance based on                         pathology results. Procedure Code(s):     --- Professional ---                        [phone], Colonoscopy, flexible; with removal of  tumor(s), polyp(s), or other lesion(s) by snare                         technique Diagnosis Code(s):     --- Professional ---                        Z12.11, Encounter for screening for malignant neoplasm                         of colon                        K63.5, Polyp of colon CPT copyright 2019 [employer] Association. All rights reserved. The codes documented in this report are preliminary and upon coder review may  be revised to meet current compliance requirements. Jonathon Bellows, MD Jonathon Bellows MD, MD 07/28/2021 10:26:30 AM This report has been signed  electronically. Number of Addenda: 0 Note Initiated On: 07/28/2021 9:52 AM Scope Withdrawal Time: 0 hours 15 minutes 13 seconds  Total Procedure Duration: 0 hours 20 minutes 26 seconds  Estimated Blood Loss:  Estimated blood loss: none.      [facility]

## 2021-07-29 LAB — SURGICAL PATHOLOGY

## 2021-08-01 ENCOUNTER — Encounter: Payer: Self-pay | Admitting: Gastroenterology

## 2021-08-02 DIAGNOSIS — G4733 Obstructive sleep apnea (adult) (pediatric): Secondary | ICD-10-CM | POA: Diagnosis not present

## 2021-08-07 ENCOUNTER — Other Ambulatory Visit: Payer: Self-pay | Admitting: Physician Assistant

## 2021-08-07 DIAGNOSIS — M810 Age-related osteoporosis without current pathological fracture: Secondary | ICD-10-CM

## 2021-08-08 ENCOUNTER — Ambulatory Visit: Payer: BC Managed Care – PPO | Admitting: Dermatology

## 2021-08-08 DIAGNOSIS — Z1283 Encounter for screening for malignant neoplasm of skin: Secondary | ICD-10-CM | POA: Diagnosis not present

## 2021-08-08 DIAGNOSIS — L578 Other skin changes due to chronic exposure to nonionizing radiation: Secondary | ICD-10-CM

## 2021-08-08 DIAGNOSIS — Z86018 Personal history of other benign neoplasm: Secondary | ICD-10-CM

## 2021-08-08 DIAGNOSIS — L720 Epidermal cyst: Secondary | ICD-10-CM | POA: Diagnosis not present

## 2021-08-08 DIAGNOSIS — L814 Other melanin hyperpigmentation: Secondary | ICD-10-CM

## 2021-08-08 DIAGNOSIS — D1800 Hemangioma unspecified site: Secondary | ICD-10-CM

## 2021-08-08 DIAGNOSIS — D229 Melanocytic nevi, unspecified: Secondary | ICD-10-CM

## 2021-08-08 DIAGNOSIS — D2371 Other benign neoplasm of skin of right lower limb, including hip: Secondary | ICD-10-CM

## 2021-08-08 DIAGNOSIS — L821 Other seborrheic keratosis: Secondary | ICD-10-CM | POA: Diagnosis not present

## 2021-08-08 DIAGNOSIS — D225 Melanocytic nevi of trunk: Secondary | ICD-10-CM

## 2021-08-09 ENCOUNTER — Encounter: Payer: Self-pay | Admitting: Physician Assistant

## 2021-08-09 ENCOUNTER — Ambulatory Visit
Admission: RE | Admit: 2021-08-09 | Discharge: 2021-08-09 | Disposition: A | Discharge status: Home / Self Care | Payer: BC Managed Care – PPO | Source: Ambulatory Visit | Attending: Obstetrics and Gynecology | Admitting: Obstetrics and Gynecology

## 2021-08-09 DIAGNOSIS — Z1231 Encounter for screening mammogram for malignant neoplasm of breast: Secondary | ICD-10-CM | POA: Insufficient documentation

## 2021-08-09 DIAGNOSIS — Z853 Personal history of malignant neoplasm of breast: Secondary | ICD-10-CM | POA: Insufficient documentation

## 2021-08-10 ENCOUNTER — Other Ambulatory Visit: Payer: Self-pay | Admitting: Physician Assistant

## 2021-08-10 DIAGNOSIS — M810 Age-related osteoporosis without current pathological fracture: Secondary | ICD-10-CM

## 2021-08-10 MED ORDER — ALENDRONATE SODIUM 70 MG PO TABS: 70.0000 mg | ORAL_TABLET | ORAL | 0 refills | Status: DC

## 2021-08-15 DIAGNOSIS — G4733 Obstructive sleep apnea (adult) (pediatric): Secondary | ICD-10-CM | POA: Diagnosis not present

## 2021-08-17 DIAGNOSIS — G4733 Obstructive sleep apnea (adult) (pediatric): Secondary | ICD-10-CM | POA: Diagnosis not present

## 2021-09-17 DIAGNOSIS — G4733 Obstructive sleep apnea (adult) (pediatric): Secondary | ICD-10-CM | POA: Diagnosis not present

## 2021-10-07 ENCOUNTER — Ambulatory Visit (INDEPENDENT_AMBULATORY_CARE_PROVIDER_SITE_OTHER): Payer: BC Managed Care – PPO | Admitting: Physician Assistant

## 2021-10-07 ENCOUNTER — Encounter: Payer: Self-pay | Admitting: Physician Assistant

## 2021-10-07 VITALS — BP 102/67 | HR 80 | Ht 61.5 in | Wt 162.4 lb

## 2021-10-07 DIAGNOSIS — Z0000 Encounter for general adult medical examination without abnormal findings: Secondary | ICD-10-CM | POA: Diagnosis not present

## 2021-10-07 DIAGNOSIS — G4709 Other insomnia: Secondary | ICD-10-CM

## 2021-10-07 DIAGNOSIS — R8290 Unspecified abnormal findings in urine: Secondary | ICD-10-CM | POA: Diagnosis not present

## 2021-10-07 DIAGNOSIS — R7303 Prediabetes: Secondary | ICD-10-CM

## 2021-10-07 DIAGNOSIS — M810 Age-related osteoporosis without current pathological fracture: Secondary | ICD-10-CM

## 2021-10-07 DIAGNOSIS — E785 Hyperlipidemia, unspecified: Secondary | ICD-10-CM | POA: Diagnosis not present

## 2021-10-07 DIAGNOSIS — R82998 Other abnormal findings in urine: Secondary | ICD-10-CM

## 2021-10-07 LAB — POCT URINALYSIS DIPSTICK
Bilirubin, UA: NEGATIVE
Blood, UA: NEGATIVE
Glucose, UA: NEGATIVE
Ketones, UA: NEGATIVE
Nitrite, UA: NEGATIVE
Protein, UA: NEGATIVE
Spec Grav, UA: 1.01 (ref 1.010–1.025)
Urobilinogen, UA: 0.2 E.U./dL
pH, UA: 6 (ref 5.0–8.0)

## 2021-10-07 NOTE — Assessment & Plan Note (Signed)
As pt is asympt besides smell to urine will hold off on tx until culture results in

## 2021-10-07 NOTE — Assessment & Plan Note (Signed)
Historically will recheck labs today

## 2021-10-07 NOTE — Progress Notes (Signed)
I,Patricia Kemp,acting as a Education administrator for [employer], PA-C.,have documented all relevant documentation on the behalf of Mikey Kirschner, PA-C,as directed by  Mikey Kirschner, PA-C while in the presence of Mikey Kirschner, PA-C.   Complete physical exam   Patient: Patricia Kemp   DOB: [birth year]   61 y.o. Female  MRN: [id number] Visit Date: 10/07/2021  Today's healthcare provider: Mikey Kirschner, PA-C   Cc. cpe  Subjective    Patricia Kemp is a 61 y.o. female who presents today for a complete physical exam.  She reports consuming a general diet. The patient does not participate in regular exercise at present. She generally feels well. She reports sleeping fairly well. She does have additional problems to discuss today.  HPI  -Patient reports unusual smell with urine for the last week. Denies dysuria, hematuria, frequency, urgency.  -Pt reports difficulty falling asleep and staying asleep. Uses CPAP.    Past Medical History:  Diagnosis Date   Allergy    Arthritis    Bleeds easily ([place])    PT STATES IT TOOK HER A LITTLE LONGER TO STOP BLEEDING AFTER SINUS SURGERY   BRCA negative 11/2020   MyRisk neg except SDHA VUS   Cancer ([place]) 09/2010   Right breast cancer   Depression    Dysplastic nevus 08/25/2020   R mid sternum, mod to severe atypia   Dysplastic nevus 08/25/2020   L pretibia, mod to severe atypia   Dysplastic nevus 08/25/2020   L calf, mod to severe atypia   Dysrhythmia    H/O IRREGULAR HEART BEAT-ASYMPTOMATIC   Headache    MIGRAINES   Heart murmur    ASYMPTOMATIC   HLD (hyperlipidemia)    Osteoporosis    in spine   Sleep apnea    NO CPAP-UNABLE TO USE DUE TO SINUS ISSUES   Past Surgical History:  Procedure Laterality Date   BREAST LUMPECTOMY Right 2012   hx of breast ca rad only   BREAST SURGERY Right 09/2010   CESAREAN SECTION     COLONOSCOPY     COLONOSCOPY WITH PROPOFOL N/A 07/28/2021   Procedure: COLONOSCOPY WITH PROPOFOL;  Surgeon: Jonathon Bellows, MD;   Location[facility] ENDOSCOPY;  Service: Gastroenterology;  Laterality: N/A;   DILATION AND CURETTAGE OF UTERUS N/A 01/13/2021   Procedure: DILATATION AND CURETTAGE;  Surgeon: Gae Dry, MD;  Location: [facility] ORS;  Service: Gynecology;  Laterality: N/A;   JOINT REPLACEMENT     RIGHT KNEE   KNEE ARTHROSCOPY Left 10/29/2019   Procedure: ARTHROSCOPY KNEE;  Surgeon: Dereck Leep, MD;  Location: [facility] ORS;  Service: Orthopedics;  Laterality: Left;   NASAL SINUS SURGERY  2008   Social History   Socioeconomic History   Marital status: Married    Spouse name: Clair Gulling   Number of children: Not on file   Years of education: Not on file   Highest education level: Not on file  Occupational History   Not on file  Tobacco Use   Smoking status: Never   Smokeless tobacco: Never  Vaping Use   Vaping Use: Never used  Substance and Sexual Activity   Alcohol use: Yes    Alcohol/week: 1.0 standard drink of alcohol    Types: 1 Glasses of wine per week    Comment: rare   Drug use: Never   Sexual activity: Yes    Comment: ha, ha, ha!  Other Topics Concern   Not on file  Social History Narrative   Not on file  Social Determinants of Health   Financial Resource Strain: Not on file  Food Insecurity: Not on file  Transportation Needs: Not on file  Physical Activity: Not on file  Stress: Not on file  Social Connections: Not on file  Intimate Partner Violence: Not on file   Family Status  Relation Name Status   Mother Jaye Beagle Deceased   Father Driscilla Moats Alive   Sister [place] Alive   Sister  Alive   Brother  Alive   Pat Uncle Fara Olden Deceased   PGF Driscilla Moats Sr. Deceased   PGM Rayna Sexton (Not Specified)   Family History  Problem Relation Age of Onset   Breast cancer Mother 6       Breast and lung   Cancer Mother    Skin cancer Father        forehead   Arthritis Father    Hearing loss Father    Vision loss Father    Depression Sister    Cancer Paternal  Uncle    Cancer Paternal Grandfather        bone   Arthritis Paternal Grandmother    Allergies  Allergen Reactions   Amoxicillin Nausea Only    Patient Care Team: Mikey Kirschner, PA-C as PCP - General (Physician Assistant)   Medications: Outpatient Medications Prior to Visit  Medication Sig   alendronate (FOSAMAX) 70 MG tablet Take 1 tablet (70 mg total) by mouth once a week.   atorvastatin (LIPITOR) 10 MG tablet TAKE 1 TABLET BY MOUTH EVERY DAY   Calcium Citrate-Vitamin D (CALCIUM CITRATE + D PO) Take 2 tablets by mouth daily. 630 mg/ 500 units   Cholecalciferol (VITAMIN D3) 125 MCG (5000 UT) TABS Take 5,000 Units by mouth daily.    FLUoxetine HCl (PROZAC PO) Take by mouth.   lidocaine-prilocaine (EMLA) cream Apply 1 application topically as needed.   Multiple Vitamins-Minerals (WOMENS 50+ MULTI VITAMIN/MIN PO) Take 1 tablet by mouth daily.    Zinc 20 MG CAPS Take 20 mg by mouth daily.   No facility-administered medications prior to visit.    Review of Systems  All other systems reviewed and are negative.   Objective     Blood pressure 102/67, pulse 80, height 5' 1.5" (1.562 m), weight 162 lb 6.4 oz (73.7 kg), SpO2 98 %.    Physical Exam Vitals reviewed.  Constitutional:      General: She is awake.     Appearance: She is well-developed. She is not ill-appearing.  HENT:     Head: Normocephalic.     Right Ear: Tympanic membrane normal.     Left Ear: Tympanic membrane normal.     Nose: Nose normal. No congestion or rhinorrhea.     Mouth/Throat:     Pharynx: No oropharyngeal exudate or posterior oropharyngeal erythema.  Eyes:     Conjunctiva/sclera: Conjunctivae normal.     Pupils: Pupils are equal, round, and reactive to light.  Neck:     Thyroid: No thyroid mass or thyromegaly.  Cardiovascular:     Rate and Rhythm: Normal rate and regular rhythm.     Heart sounds: Normal heart sounds.  Pulmonary:     Effort: Pulmonary effort is normal. No respiratory distress.      Breath sounds: Normal breath sounds.  Abdominal:     Palpations: Abdomen is soft.     Tenderness: There is no abdominal tenderness.  Musculoskeletal:     Right lower leg: No swelling. No edema.  Left lower leg: No swelling. No edema.  Lymphadenopathy:     Cervical: No cervical adenopathy.  Skin:    General: Skin is warm.  Neurological:     General: No focal deficit present.     Mental Status: She is alert and oriented to person, place, and time.  Psychiatric:        Attention and Perception: Attention normal.        Mood and Affect: Mood normal.        Speech: Speech normal.        Behavior: Behavior normal. Behavior is cooperative.     Last depression screening scores    01/28/2021    3:57 PM 10/06/2020   10:00 AM 04/21/2020    1:17 PM  PHQ 2/9 Scores  PHQ - 2 Score 0 0 1  PHQ- 9 Score 0 2 4   Last fall risk screening    01/28/2021    3:57 PM  [place] in the past year? 0  Number falls in past yr: 0  Injury with Fall? 0  Risk for fall due to : No Fall Risks   Last Audit-C alcohol use screening    01/28/2021    3:57 PM  Alcohol Use Disorder Test (AUDIT)  1. How often do you have a drink containing alcohol? 1  2. How many drinks containing alcohol do you have on a typical day when you are drinking? 0  3. How often do you have six or more drinks on one occasion? 0  AUDIT-C Score 1   A score of 3 or more in women, and 4 or more in men indicates increased risk for alcohol abuse, EXCEPT if all of the points are from question 1   Results for orders placed or performed in visit on 10/07/21  POCT Urinalysis Dipstick  Result Value Ref Range   Color, UA     Clarity, UA     Glucose, UA Negative Negative   Bilirubin, UA negative    Ketones, UA negative    Spec Grav, UA 1.010 1.010 - 1.025   Blood, UA negative    pH, UA 6.0 5.0 - 8.0   Protein, UA Negative Negative   Urobilinogen, UA 0.2 0.2 or 1.0 E.U./dL   Nitrite, UA negative    Leukocytes, UA  Moderate (2+) (A) Negative   Appearance     Odor      Assessment & Plan    Routine Health Maintenance and Physical Exam  Exercise Activities and Dietary recommendations --balanced diet high in fiber and protein, low in sugars, carbs, fats. --physical activity/exercise 30 minutes 3-5 times a week     Immunization History  Administered Date(s) Administered   Fluad Quad(high Dose 65+) 12/18/2019   Influenza,inj,Quad PF,6+ Mos 10/23/2018, 01/26/2021   PFIZER Comirnaty(Gray Top)Covid-19 Tri-Sucrose Vaccine 07/21/2020   PFIZER(Purple Top)SARS-COV-2 Vaccination 05/05/2019, 05/26/2019, 11/28/2019   Td 08/14/2019   Zoster Recombinat (Shingrix) 01/26/2021, 03/30/2021    Health Maintenance  Topic Date Due   COVID-19 Vaccine (5 - Pfizer risk series) 09/15/2020   INFLUENZA VACCINE  09/13/2021   MAMMOGRAM  08/10/2022   PAP SMEAR-Modifier  11/09/2023   COLONOSCOPY (Pts 45-79yrs Insurance coverage will need to be confirmed)  07/29/2026   TETANUS/TDAP  08/13/2029   Hepatitis C Screening  Completed   HIV Screening  Completed   Zoster Vaccines- Shingrix  Completed   HPV VACCINES  Aged Out    Discussed health benefits of physical activity, and encouraged  her to engage in regular exercise appropriate for her age and condition.  Problem List Items Addressed This Visit       Musculoskeletal and Integument   Osteoporosis    Last DXA I see was 10/21 due 10/23 Continue fosamax started in 2020  Continue calcium and vit d, weight bearing exercise      Relevant Orders   DG Bone Density     Other   Other insomnia    Discussed sleep hygiene, melatonin, otc meds.  Advised consistency.  If no improvement with changes, can consider trazodone      Hyperlipidemia    Managed on atorvastatin 10 mg Fasting lipids ordered--but pt will come back as she is not fasting      Relevant Orders   CBC w/Diff/Platelet   Comprehensive Metabolic Panel (CMET)   Prediabetes    Historically will recheck  labs today      Relevant Orders   Hemoglobin A1c   Leukocytes in urine    As pt is asympt besides smell to urine will hold off on tx until culture results in      Relevant Orders   Urine Culture   Other Visit Diagnoses     Annual physical exam    -  Primary   Relevant Orders   TSH   Foul smelling urine       Relevant Orders   POCT Urinalysis Dipstick (Completed)   Urine Culture        Return in about 1 year (around 10/08/2022) for CPE.     I, Mikey Kirschner, PA-C have reviewed all documentation for this visit. The documentation on  10/07/2021 for the exam, diagnosis, procedures, and orders are all accurate and complete.  Mikey Kirschner, PA-C [facility] [address] [place], [place], [address] Office: [phone] Fax: [place]

## 2021-10-07 NOTE — Assessment & Plan Note (Signed)
Discussed sleep hygiene, melatonin, otc meds.  Advised consistency.  If no improvement with changes, can consider trazodone

## 2021-10-07 NOTE — Assessment & Plan Note (Addendum)
Last DXA I see was 10/21 due 10/23 Continue fosamax started in 2020  Continue calcium and vit d, weight bearing exercise

## 2021-10-07 NOTE — Assessment & Plan Note (Addendum)
Managed on atorvastatin 10 mg Fasting lipids ordered--but pt will come back as she is not fasting

## 2021-10-08 LAB — COMPREHENSIVE METABOLIC PANEL
ALT: 23 IU/L (ref 0–32)
AST: 24 IU/L (ref 0–40)
Albumin/Globulin Ratio: 2.6 — ABNORMAL HIGH (ref 1.2–2.2)
Albumin: 4.9 g/dL (ref 3.9–4.9)
Alkaline Phosphatase: 119 IU/L (ref 44–121)
BUN/Creatinine Ratio: 16 (ref 12–28)
BUN: 13 mg/dL (ref 8–27)
Bilirubin Total: 0.5 mg/dL (ref 0.0–1.2)
CO2: 25 mmol/L (ref 20–29)
Calcium: 10 mg/dL (ref 8.7–10.3)
Chloride: 102 mmol/L (ref 96–106)
Creatinine, Ser: 0.79 mg/dL (ref 0.57–1.00)
Globulin, Total: 1.9 g/dL (ref 1.5–4.5)
Glucose: 123 mg/dL — ABNORMAL HIGH (ref 70–99)
Potassium: 3.8 mmol/L (ref 3.5–5.2)
Sodium: 142 mmol/L (ref 134–144)
Total Protein: 6.8 g/dL (ref 6.0–8.5)
eGFR: 84 mL/min/{1.73_m2} (ref >59)

## 2021-10-08 LAB — CBC WITH DIFFERENTIAL/PLATELET
Basophils Absolute: 0 10*3/uL (ref 0.0–0.2)
Basos: 1 %
EOS (ABSOLUTE): 0.1 10*3/uL (ref 0.0–0.4)
Eos: 2 %
Hematocrit: 39.1 % (ref 34.0–46.6)
Hemoglobin: 13.1 g/dL (ref 11.1–15.9)
Immature Grans (Abs): 0 10*3/uL (ref 0.0–0.1)
Immature Granulocytes: 0 %
Lymphocytes Absolute: 2.7 10*3/uL (ref 0.7–3.1)
Lymphs: 42 %
MCH: 29.3 pg (ref 26.6–33.0)
MCHC: 33.5 g/dL (ref 31.5–35.7)
MCV: 88 fL (ref 79–97)
Monocytes Absolute: 0.4 10*3/uL (ref 0.1–0.9)
Monocytes: 6 %
Neutrophils Absolute: 3.2 10*3/uL (ref 1.4–7.0)
Neutrophils: 49 %
Platelets: 233 10*3/uL (ref 150–450)
RBC: 4.47 x10E6/uL (ref 3.77–5.28)
RDW: 12.1 % (ref 11.7–15.4)
WBC: 6.4 10*3/uL (ref 3.4–10.8)

## 2021-10-08 LAB — HEMOGLOBIN A1C
Est. average glucose Bld gHb Est-mCnc: 126 mg/dL
Hgb A1c MFr Bld: 6 % — ABNORMAL HIGH (ref 4.8–5.6)

## 2021-10-08 LAB — TSH: TSH: 1.61 u[IU]/mL (ref 0.450–4.500)

## 2021-10-09 ENCOUNTER — Encounter: Payer: Self-pay | Admitting: Physician Assistant

## 2021-10-09 LAB — URINE CULTURE

## 2021-10-18 DIAGNOSIS — G4733 Obstructive sleep apnea (adult) (pediatric): Secondary | ICD-10-CM | POA: Diagnosis not present

## 2021-10-19 ENCOUNTER — Other Ambulatory Visit: Payer: Self-pay | Admitting: Physician Assistant

## 2021-10-19 DIAGNOSIS — E785 Hyperlipidemia, unspecified: Secondary | ICD-10-CM

## 2021-11-02 DIAGNOSIS — G4733 Obstructive sleep apnea (adult) (pediatric): Secondary | ICD-10-CM | POA: Diagnosis not present

## 2021-11-04 ENCOUNTER — Other Ambulatory Visit: Payer: Self-pay | Admitting: Physician Assistant

## 2021-11-04 DIAGNOSIS — M810 Age-related osteoporosis without current pathological fracture: Secondary | ICD-10-CM

## 2022-01-14 ENCOUNTER — Other Ambulatory Visit: Payer: Self-pay | Admitting: Physician Assistant

## 2022-01-14 DIAGNOSIS — E785 Hyperlipidemia, unspecified: Secondary | ICD-10-CM

## 2022-01-16 NOTE — Progress Notes (Unsigned)
      Established patient visit   Patient: Patricia Kemp   DOB: 1960-03-20   61 y.o. Female  MRN: [id number] Visit Date: 01/17/2022  Today's healthcare provider: Mikey Kirschner, PA-C   No chief complaint on file.  Subjective    HPI  Pain  She reports {chronicity:119221} {location:1} pain. {There was/was not:31712} an injury that may have caused the pain. The pain started {onset:119223} and is {progression:119226}. The pain {does/does not:200015} radiate {describe radiation if present:1}. The pain is described as {quality:31200}, is {severity:119268} in intensity, occurring {frequency of symptoms:119294}. {Symptoms are worse in the:16448:::1}  {Aggravating factors:16449:::1} {Relieving factors:16449:::1}.  She has tried {treatments tried:173219}{improvement:119303}.   ---------------------------------------------------------------------------------------------------   Medications: Outpatient Medications Prior to Visit  Medication Sig   alendronate (FOSAMAX) 70 MG tablet TAKE 1 TABLET (70 MG TOTAL) BY MOUTH ONCE A WEEK   atorvastatin (LIPITOR) 10 MG tablet TAKE 1 TABLET BY MOUTH EVERY DAY   Calcium Citrate-Vitamin D (CALCIUM CITRATE + D PO) Take 2 tablets by mouth daily. 630 mg/ 500 units   Cholecalciferol (VITAMIN D3) 125 MCG (5000 UT) TABS Take 5,000 Units by mouth daily.    FLUoxetine HCl (PROZAC PO) Take by mouth.   lidocaine-prilocaine (EMLA) cream Apply 1 application topically as needed.   Multiple Vitamins-Minerals (WOMENS 50+ MULTI VITAMIN/MIN PO) Take 1 tablet by mouth daily.    Zinc 20 MG CAPS Take 20 mg by mouth daily.   No facility-administered medications prior to visit.    Review of Systems  {Labs  Heme  Chem  Endocrine  Serology  Results Review (optional):23779}   Objective    There were no vitals taken for this visit. {Show previous vital signs (optional):23777}  Physical Exam  ***  No results found for any visits on 01/17/22.  Assessment & Plan      ***  No follow-ups on file.      {provider attestation***:1}   Mikey Kirschner, PA-C  [facility] [phone] (phone) [phone] (fax)  [place]

## 2022-01-17 ENCOUNTER — Encounter: Payer: Self-pay | Admitting: Physician Assistant

## 2022-01-17 ENCOUNTER — Ambulatory Visit: Payer: BC Managed Care – PPO | Admitting: Physician Assistant

## 2022-01-17 VITALS — BP 126/56 | HR 89 | Temp 98.0°F | Wt 164.0 lb

## 2022-01-17 DIAGNOSIS — M25511 Pain in right shoulder: Secondary | ICD-10-CM

## 2022-01-17 DIAGNOSIS — F419 Anxiety disorder, unspecified: Secondary | ICD-10-CM | POA: Diagnosis not present

## 2022-01-17 DIAGNOSIS — F32A Depression, unspecified: Secondary | ICD-10-CM

## 2022-01-17 MED ORDER — DULOXETINE HCL 30 MG PO CPEP: 30.0000 mg | ORAL_CAPSULE | Freq: Every day | ORAL | 3 refills | Status: DC

## 2022-01-17 NOTE — Assessment & Plan Note (Signed)
Stable on 30 mg of Cymbalta.  Refilled.

## 2022-02-08 DIAGNOSIS — G4733 Obstructive sleep apnea (adult) (pediatric): Secondary | ICD-10-CM | POA: Diagnosis not present

## 2022-05-10 DIAGNOSIS — G4733 Obstructive sleep apnea (adult) (pediatric): Secondary | ICD-10-CM | POA: Diagnosis not present

## 2022-05-15 DIAGNOSIS — M67814 Other specified disorders of tendon, left shoulder: Secondary | ICD-10-CM | POA: Diagnosis not present

## 2022-06-06 DIAGNOSIS — M25512 Pain in left shoulder: Secondary | ICD-10-CM | POA: Diagnosis not present

## 2022-06-16 ENCOUNTER — Other Ambulatory Visit: Payer: Self-pay | Admitting: Physician Assistant

## 2022-06-16 DIAGNOSIS — M810 Age-related osteoporosis without current pathological fracture: Secondary | ICD-10-CM

## 2022-06-16 NOTE — Telephone Encounter (Signed)
Requested medication (s) are due for refill today: yes  Requested medication (s) are on the active medication list: yes  Last refill:  11/04/21  Future visit scheduled: 11/04/21  Notes to clinic:  Unable to refill per protocol, Rx expired. Medication is not in the current medication list, routing for approval.      Requested Prescriptions  Pending Prescriptions Disp Refills   alendronate (FOSAMAX) 70 MG tablet [Pharmacy Med Name: ALENDRONATE SODIUM 70 MG TAB] 12 tablet 1    Sig: TAKE 1 TABLET (70 MG TOTAL) BY MOUTH ONCE A WEEK     Endocrinology:  Bisphosphonates Failed - 06/16/2022  2:24 AM      Failed - Vitamin D in normal range and within 360 days    Vit D, 25-Hydroxy  Date Value Ref Range Status  10/06/2020 72.4 30.0 - 100.0 ng/mL Final    Comment:    Vitamin D deficiency has been defined by the Institute of Medicine and an Endocrine Society practice guideline as a level of serum 25-OH vitamin D less than 20 ng/mL (1,2). The Endocrine Society went on to further define vitamin D insufficiency as a level between 21 and 29 ng/mL (2). 1. IOM (Institute of Medicine). 2010. Dietary reference    intakes for calcium and D. [place]: The    [employer]. 2. Holick MF, Binkley [place], Bischoff-Ferrari HA, et al.    Evaluation, treatment, and prevention of vitamin D    deficiency: an Endocrine Society clinical practice    guideline. JCEM. 2011 Jul; 96(7):1911-30.          Failed - Mg Level in normal range and within 360 days    Magnesium  Date Value Ref Range Status  10/06/2020 2.2 1.6 - 2.3 mg/dL Final         Failed - Phosphate in normal range and within 360 days    No results found for: "PHOS"       Failed - Bone Mineral Density or Dexa Scan completed in the last 2 years      Passed - Ca in normal range and within 360 days    Calcium  Date Value Ref Range Status  10/07/2021 10.0 8.7 - 10.3 mg/dL Final         Passed - Cr in normal range and within 360 days     Creatinine, Ser  Date Value Ref Range Status  10/07/2021 0.79 0.57 - 1.00 mg/dL Final         Passed - eGFR is 30 or above and within 360 days    GFR calc Af Amer  Date Value Ref Range Status  08/14/2019 93 >59 mL/min/1.73 Final    Comment:    **[facility] currently reports eGFR in compliance with the current**   recommendations of the [employer]. [facility] will   update reporting as new guidelines are published from the NKF-ASN   Task force.    GFR calc non Af Amer  Date Value Ref Range Status  08/14/2019 81 >59 mL/min/1.73 Final   eGFR  Date Value Ref Range Status  10/07/2021 84 >59 mL/min/1.73 Final         Passed - Valid encounter within last 12 months    Recent Outpatient Visits           5 months ago Acute pain of right shoulder   [facility] Alfredia Ferguson, PA-C   8 months ago Annual physical exam   [facility]  Alfredia Ferguson, PA-C   1 year ago Acute recurrent pansinusitis   [facility] Alfredia Ferguson, PA-C   1 year ago Acute non-recurrent pansinusitis   [facility] Merita Norton T, FNP   1 year ago Swelling of left hand   [facility] Malva Limes, MD       Future Appointments             In 3 months Ok Edwards, Lou Cal [facility], [facility]

## 2022-07-06 NOTE — Progress Notes (Signed)
PCP: Alfredia Ferguson, PA-C   Chief Complaint  Patient presents with   Gynecologic Exam    No concerns    HPI:      [name] is a 62 y.o. No obstetric history on file. whose LMP was No LMP recorded. Patient is postmenopausal., presents today for her annual examination.  Her menses are absent due to menopause. She has infrequent vasomotor sx. S/p D&C 12/22 for PMB/endometrial thickening with Dr. Tiburcio Pea with neg pathology. No PMB since.   Sex activity: currently sexually active. She does have vaginal dryness and uses lubricants with relief. No pain/bleeding.  Last Pap: 11/08/20  Results were: no abnormalities /neg HPV DNA.  Hx of STDs: none  Last mammogram: 08/09/21 Results were: normal--routine follow-up in 12 months There is a FH of breast cancer in her mom. There is no FH of ovarian cancer. Sister with colon cancer. The patient does not do self-breast exams. She has a hx of RT breast cancer age 36. Did radiation and treated with tamoxifen for 5 yrs. No longer doing tx or seeing oncology. Pt had neg MyRisk testing 10/22. Had neg breast MRI 3/23 for screening; not needed yearly as preventive  Colonoscopy: 2018 in [place] and 6/23 with [facility] with polyp; Repeat due after 7 years (pt wants to do after 5 yrs due to [facility]). FH colon cancer in her sister.   DEXA: 2019 with osteoporosis in spine and osteopenia in hip; treating with fosamax with PCP. DEXA done 10/21 with osteopenia in spine and hip. Has appt 6/24  Tobacco use: The patient denies current or previous tobacco use. Alcohol use: social drinker  No drug use Exercise: moderately active  She does get adequate calcium and Vitamin D in her diet.  Labs with PCP.   Past Medical History:  Diagnosis Date   Allergy    Arthritis    Bleeds easily (HCC)    PT STATES IT TOOK HER A LITTLE LONGER TO STOP BLEEDING AFTER SINUS SURGERY   BRCA negative 11/2020   MyRisk neg except SDHA VUS   Cancer (HCC) 09/2010   Right breast cancer    Depression    Dysplastic nevus 08/25/2020   R mid sternum, mod to severe atypia   Dysplastic nevus 08/25/2020   L pretibia, mod to severe atypia   Dysplastic nevus 08/25/2020   L calf, mod to severe atypia   Dysrhythmia    H/O IRREGULAR HEART BEAT-ASYMPTOMATIC   Headache    MIGRAINES   Heart murmur    ASYMPTOMATIC   HLD (hyperlipidemia)    Osteoporosis    in spine   Sleep apnea    NO CPAP-UNABLE TO USE DUE TO SINUS ISSUES    Past Surgical History:  Procedure Laterality Date   BREAST LUMPECTOMY Right 2012   hx of breast ca rad only   BREAST SURGERY Right 09/2010   CESAREAN SECTION     COLONOSCOPY     COLONOSCOPY WITH PROPOFOL N/A 07/28/2021   Procedure: COLONOSCOPY WITH PROPOFOL;  Surgeon: Wyline Mood, MD;  [facility];  Service: Gastroenterology;  Laterality: N/A;   DILATION AND CURETTAGE OF UTERUS N/A 01/13/2021   Procedure: DILATATION AND CURETTAGE;  Surgeon: Nadara Mustard, MD;  Location: [facility] ORS;  Service: Gynecology;  Laterality: N/A;   JOINT REPLACEMENT     RIGHT KNEE   KNEE ARTHROSCOPY Left 10/29/2019   Procedure: ARTHROSCOPY KNEE;  Surgeon: Donato Heinz, MD;  Location: [facility] ORS;  Service: Orthopedics;  Laterality: Left;  NASAL SINUS SURGERY  2008    Family History  Problem Relation Age of Onset   Breast cancer Mother 22       Breast and lung   Cancer Mother    Skin cancer Father        forehead   Arthritis Father    Hearing loss Father    Vision loss Father    Depression Sister    Cancer Paternal Uncle    Cancer Paternal Grandfather        bone   Arthritis Paternal Grandmother     Social History   Socioeconomic History   Marital status: Married    Spouse name: Rosanne Ashing   Number of children: Not on file   Years of education: Not on file   Highest education level: Not on file  Occupational History   Not on file  Tobacco Use   Smoking status: Never   Smokeless tobacco: Never  Vaping Use   Vaping Use: Never used  Substance and  Sexual Activity   Alcohol use: Yes    Alcohol/week: 1.0 standard drink of alcohol    Types: 1 Glasses of wine per week    Comment: rare   Drug use: Never   Sexual activity: Yes    Comment: ha, ha, ha!  Other Topics Concern   Not on file  Social History Narrative   Not on file   Social Determinants of Health   Financial Resource Strain: Not on file  Food Insecurity: Not on file  Transportation Needs: Not on file  Physical Activity: Not on file  Stress: Not on file  Social Connections: Not on file  Intimate Partner Violence: Not on file     Current Outpatient Medications:    alendronate (FOSAMAX) 70 MG tablet, TAKE 1 TABLET (70 MG TOTAL) BY MOUTH ONCE A WEEK, Disp: 12 tablet, Rfl: 1   atorvastatin (LIPITOR) 10 MG tablet, TAKE 1 TABLET BY MOUTH EVERY DAY, Disp: 90 tablet, Rfl: 1   Calcium Citrate-Vitamin D (CALCIUM CITRATE + D PO), Take 2 tablets by mouth daily. 630 mg/ 500 units, Disp: , Rfl:    Cholecalciferol (VITAMIN D3) 125 MCG (5000 UT) TABS, Take 5,000 Units by mouth daily. , Disp: , Rfl:    DULoxetine (CYMBALTA) 30 MG capsule, Take 1 capsule (30 mg total) by mouth daily., Disp: 90 capsule, Rfl: 3   lidocaine-prilocaine (EMLA) cream, Apply 1 application topically as needed., Disp: 30 g, Rfl: 0   Multiple Vitamins-Minerals (WOMENS 50+ MULTI VITAMIN/MIN PO), Take 1 tablet by mouth daily. , Disp: , Rfl:    Zinc 20 MG CAPS, Take 20 mg by mouth daily., Disp: , Rfl:      ROS:  Review of Systems  Constitutional:  Negative for fatigue, fever and unexpected weight change.  Respiratory:  Negative for cough, shortness of breath and wheezing.   Cardiovascular:  Negative for chest pain, palpitations and leg swelling.  Gastrointestinal:  Negative for blood in stool, constipation, diarrhea, nausea and vomiting.  Endocrine: Negative for cold intolerance, heat intolerance and polyuria.  Genitourinary:  Negative for dyspareunia, dysuria, flank pain, frequency, genital sores,  hematuria, menstrual problem, pelvic pain, urgency, vaginal bleeding, vaginal discharge and vaginal pain.  Musculoskeletal:  Negative for arthralgias, back pain, joint swelling and myalgias.  Skin:  Negative for rash.  Neurological:  Negative for dizziness, syncope, light-headedness, numbness and headaches.  Hematological:  Negative for adenopathy.  Psychiatric/Behavioral:  Negative for agitation, confusion, sleep disturbance and suicidal ideas. The patient is  not nervous/anxious.   BREAST: No symptoms    Objective: BP 100/64   Ht 5\' 1"  (1.549 m)   Wt 174 lb (78.9 kg)   BMI 32.88 kg/m    Physical Exam Constitutional:      Appearance: She is well-developed.  Genitourinary:     Vulva normal.     Right Labia: No rash, tenderness or lesions.    Left Labia: No tenderness, lesions or rash.    No vaginal discharge, erythema or tenderness.      Right Adnexa: not tender and no mass present.    Left Adnexa: not tender and no mass present.    No cervical friability or polyp.     Uterus is not enlarged or tender.  Breasts:    Right: No mass, nipple discharge, skin change or tenderness.     Left: No mass, nipple discharge, skin change or tenderness.  Neck:     Thyroid: No thyromegaly.  Cardiovascular:     Rate and Rhythm: Normal rate and regular rhythm.     Heart sounds: Normal heart sounds. No murmur heard. Pulmonary:     Effort: Pulmonary effort is normal.     Breath sounds: Normal breath sounds.  Abdominal:     Palpations: Abdomen is soft.     Tenderness: There is no abdominal tenderness. There is no guarding or rebound.  Musculoskeletal:        General: Normal range of motion.     Cervical back: Normal range of motion.  Lymphadenopathy:     Cervical: No cervical adenopathy.  Neurological:     General: No focal deficit present.     Mental Status: She is alert and oriented to person, place, and time.     Cranial Nerves: No cranial nerve deficit.  Skin:    General: Skin is  warm and dry.  Psychiatric:        Mood and Affect: Mood normal.        Behavior: Behavior normal.        Thought Content: Thought content normal.        Judgment: Judgment normal.  Vitals reviewed.    Assessment/Plan: Encounter for annual routine gynecological examination  Encounter for screening mammogram for malignant neoplasm of breast - Plan: MM 3D SCREEN BREAST BILATERAL; pt to schedule mammo  History of breast cancer - Plan: MM 3D SCREEN BREAST BILATERAL; no longer followed by oncology/no tx currently. Doing well         GYN counsel breast self exam, mammography screening, menopause, adequate intake of calcium and vitamin D, diet and exercise    F/U  Return in about 1 year (around 07/11/2023).  Alyse Kathan B. Lonni Dirden, PA-C 07/11/2022 10:12 AM

## 2022-07-11 ENCOUNTER — Encounter: Payer: Self-pay | Admitting: Obstetrics and Gynecology

## 2022-07-11 ENCOUNTER — Ambulatory Visit (INDEPENDENT_AMBULATORY_CARE_PROVIDER_SITE_OTHER): Payer: BC Managed Care – PPO | Admitting: Obstetrics and Gynecology

## 2022-07-11 VITALS — BP 100/64 | Ht 61.0 in | Wt 174.0 lb

## 2022-07-11 DIAGNOSIS — Z853 Personal history of malignant neoplasm of breast: Secondary | ICD-10-CM

## 2022-07-11 DIAGNOSIS — Z1231 Encounter for screening mammogram for malignant neoplasm of breast: Secondary | ICD-10-CM

## 2022-07-11 DIAGNOSIS — Z01419 Encounter for gynecological examination (general) (routine) without abnormal findings: Secondary | ICD-10-CM

## 2022-07-11 NOTE — Patient Instructions (Addendum)
I value your feedback and you entrusting us with your care. If you get a [facility] patient survey, I would appreciate you taking the time to let us know about your experience today. Thank you!  [facility] ([place])--[phone]  

## 2022-08-08 ENCOUNTER — Ambulatory Visit
Admission: RE | Admit: 2022-08-08 | Discharge: 2022-08-08 | Disposition: A | Discharge status: Home / Self Care | Payer: BC Managed Care – PPO | Source: Ambulatory Visit | Attending: Physician Assistant | Admitting: Physician Assistant

## 2022-08-08 DIAGNOSIS — M810 Age-related osteoporosis without current pathological fracture: Secondary | ICD-10-CM | POA: Insufficient documentation

## 2022-08-08 DIAGNOSIS — M8589 Other specified disorders of bone density and structure, multiple sites: Secondary | ICD-10-CM | POA: Diagnosis not present

## 2022-08-08 DIAGNOSIS — Z780 Asymptomatic menopausal state: Secondary | ICD-10-CM | POA: Diagnosis not present

## 2022-08-08 DIAGNOSIS — G4733 Obstructive sleep apnea (adult) (pediatric): Secondary | ICD-10-CM | POA: Diagnosis not present

## 2022-08-10 DIAGNOSIS — G4733 Obstructive sleep apnea (adult) (pediatric): Secondary | ICD-10-CM | POA: Diagnosis not present

## 2022-08-21 ENCOUNTER — Ambulatory Visit: Payer: BC Managed Care – PPO | Admitting: Dermatology

## 2022-08-21 VITALS — BP 120/72

## 2022-08-21 DIAGNOSIS — L814 Other melanin hyperpigmentation: Secondary | ICD-10-CM

## 2022-08-21 DIAGNOSIS — Z86018 Personal history of other benign neoplasm: Secondary | ICD-10-CM

## 2022-08-21 DIAGNOSIS — D225 Melanocytic nevi of trunk: Secondary | ICD-10-CM | POA: Diagnosis not present

## 2022-08-21 DIAGNOSIS — D2371 Other benign neoplasm of skin of right lower limb, including hip: Secondary | ICD-10-CM

## 2022-08-21 DIAGNOSIS — L578 Other skin changes due to chronic exposure to nonionizing radiation: Secondary | ICD-10-CM | POA: Diagnosis not present

## 2022-08-21 DIAGNOSIS — Z1283 Encounter for screening for malignant neoplasm of skin: Secondary | ICD-10-CM | POA: Diagnosis not present

## 2022-08-21 DIAGNOSIS — L821 Other seborrheic keratosis: Secondary | ICD-10-CM

## 2022-08-21 DIAGNOSIS — R238 Other skin changes: Secondary | ICD-10-CM

## 2022-08-21 DIAGNOSIS — W908XXA Exposure to other nonionizing radiation, initial encounter: Secondary | ICD-10-CM

## 2022-08-21 DIAGNOSIS — L918 Other hypertrophic disorders of the skin: Secondary | ICD-10-CM

## 2022-08-21 DIAGNOSIS — L720 Epidermal cyst: Secondary | ICD-10-CM

## 2022-08-21 DIAGNOSIS — D239 Other benign neoplasm of skin, unspecified: Secondary | ICD-10-CM

## 2022-08-21 DIAGNOSIS — D229 Melanocytic nevi, unspecified: Secondary | ICD-10-CM

## 2022-08-21 DIAGNOSIS — L738 Other specified follicular disorders: Secondary | ICD-10-CM

## 2022-08-21 NOTE — Progress Notes (Signed)
Follow-Up Visit   Subjective  Patricia Kemp is a 62 y.o. female who presents for the following: Skin Cancer Screening and Full Body Skin Exam, hx of Dysplastic nevi, check bump post scalp  The patient presents for Total-Body Skin Exam (TBSE) for skin cancer screening and mole check. The patient has spots, moles and lesions to be evaluated, some may be new or changing and the patient has concerns that these could be cancer.    The following portions of the chart were reviewed this encounter and updated as appropriate: medications, allergies, medical history  Review of Systems:  No other skin or systemic complaints except as noted in HPI or Assessment and Plan.  Objective  Well appearing patient in no apparent distress; mood and affect are within normal limits.  A full examination was performed including scalp, head, eyes, ears, nose, lips, neck, chest, axillae, abdomen, back, buttocks, bilateral upper extremities, bilateral lower extremities, hands, feet, fingers, toes, fingernails, and toenails. All findings within normal limits unless otherwise noted below.   Relevant physical exam findings are noted in the Assessment and Plan.    Assessment & Plan   LENTIGINES, SEBORRHEIC KERATOSES, HEMANGIOMAS - Benign normal skin lesions - Benign-appearing - Call for any changes  SEBORRHEIC KERATOSIS Exam: stuck on waxy paps L mandible  Treatment:  Benign-appearing.  Observation.  Call clinic for new or changing moles.  Recommend daily use of broad spectrum spf 30+ sunscreen to sun-exposed areas.  Discussed cosmetic removal $60 for first lesion and $15 for each additional lesion treated on the same day.  ACTINIC DAMAGE - Chronic condition, secondary to cumulative UV/sun exposure - diffuse scaly erythematous macules with underlying dyspigmentation - Recommend daily broad spectrum sunscreen SPF 30+ to sun-exposed areas, reapply every 2 hours as needed.  - Staying in the shade or wearing long  sleeves, sun glasses (UVA+UVB protection) and wide brim hats (4-inch brim around the entire circumference of the hat) are also recommended for sun protection.  - Call for new or changing lesions.  SKIN CANCER SCREENING PERFORMED TODAY.  MELANOCYTIC NEVI - Tan-brown and/or pink-flesh-colored symmetric macules and papules - Benign appearing on exam today - Observation - Call clinic for new or changing moles - Recommend daily use of broad spectrum spf 30+ sunscreen to sun-exposed areas.  NEVI Exam:  - Central upper abdomen 5.0 x 4.0 med dark brown speckled thin pap - L axilla 4.0 x 3.3mm med dark brown thin pap  Treatment Plan: Benign-appearing. Stable compared to previous visit and photos from 08/19/2019. Observation.  Call clinic for new or changing moles.  Recommend daily use of broad spectrum spf 30+ sunscreen to sun-exposed areas.    HISTORY OF DYSPLASTIC NEVUS No evidence of recurrence today Recommend regular full body skin exams Recommend daily broad spectrum sunscreen SPF 30+ to sun-exposed areas, reapply every 2 hours as needed.  Call if any new or changing lesions are noted between office visits  - R mid sternum, L pretibia, L calf  Milia Exam: firm white papule L upper occipital scalp, firm white papules face  Treatment:  Benign-appearing.  Observation.  Call clinic for new or changing moles.  Recommend daily use of broad spectrum spf 30+ sunscreen to sun-exposed areas.   Discussed Differin 0.1% gel to milia at bedtime, sample of La Roche-Posay Effaclar 0.1% given to patient.  Sebaceous Hyperplasia - Small yellow papules with a central dell - Benign-appearing - Observe. Call for changes.  - face  DERMATOFIBROMA Exam: Firm pink/brown papulenodule with  dimple sign R upper knee, R pretibia  Treatment Plan: A dermatofibroma is a benign growth possibly related to trauma, such as an insect bite, cut from shaving, or inflamed acne-type bump.  Treatment options to remove  include shave or excision with resulting scar and risk of recurrence.  Since benign-appearing and not bothersome, will observe for now.    SKIN TAG VS NEVUS Exam: fleshy pap R flank  Treatment Plan: Benign-appearing.  Observation.  Call clinic for new or changing moles.  Recommend daily use of broad spectrum spf 30+ sunscreen to sun-exposed areas.     Return in about 1 year (around 08/21/2023) for TBSE, Hx of Dysplastic nevi.  I, Ardis Rowan, RMA, am acting as scribe for Willeen Niece, MD .   Documentation: I have reviewed the above documentation for accuracy and completeness, and I agree with the above.  Willeen Niece, MD

## 2022-08-21 NOTE — Patient Instructions (Addendum)
Seborrheic Keratosis  What causes seborrheic keratoses? Seborrheic keratoses are harmless, common skin growths that first appear during adult life.  As time goes by, more growths appear.  Some people may develop a large number of them.  Seborrheic keratoses appear on both covered and uncovered body parts.  They are not caused by sunlight.  The tendency to develop seborrheic keratoses can be inherited.  They vary in color from skin-colored to gray, brown, or even black.  They can be either smooth or have a rough, warty surface.   Seborrheic keratoses are superficial and look as if they were stuck on the skin.  Under the microscope this type of keratosis looks like layers upon layers of skin.  That is why at times the top layer may seem to fall off, but the rest of the growth remains and re-grows.    Treatment Seborrheic keratoses do not need to be treated, but can easily be removed in the office.  Seborrheic keratoses often cause symptoms when they rub on clothing or jewelry.  Lesions can be in the way of shaving.  If they become inflamed, they can cause itching, soreness, or burning.  Removal of a seborrheic keratosis can be accomplished by freezing, burning, or surgery. If any spot bleeds, scabs, or grows rapidly, please return to have it checked, as these can be an indication of a skin cancer.  Differin 0.1% gel for the white bumps on face   Due to recent changes in healthcare laws, you may see results of your pathology and/or laboratory studies on MyChart before the doctors have had a chance to review them. We understand that in some cases there may be results that are confusing or concerning to you. Please understand that not all results are received at the same time and often the doctors may need to interpret multiple results in order to provide you with the best plan of care or course of treatment. Therefore, we ask that you please give [place] 2 business days to thoroughly review all your results  before contacting the office for clarification. Should we see a critical lab result, you will be contacted sooner.   If You Need Anything After Your Visit  If you have any questions or concerns for your doctor, please call our main line at [phone] and press option 4 to reach your doctor's medical assistant. If no one answers, please leave a voicemail as directed and we will return your call as soon as possible. Messages left after 4 pm will be answered the following business day.   You may also send [place] a message via MyChart. We typically respond to MyChart messages within 1-2 business days.  For prescription refills, please ask your pharmacy to contact our office. Our fax number is [phone].  If you have an urgent issue when the clinic is closed that cannot wait until the next business day, you can page your doctor at the number below.    Please note that while we do our best to be available for urgent issues outside of office hours, we are not available 24/7.   If you have an urgent issue and are unable to reach [place], you may choose to seek medical care at your doctor's office, retail clinic, urgent care center, or emergency room.  If you have a medical emergency, please immediately call 911 or go to the emergency department.  Pager Numbers  - Dr. Gwen Pounds: [phone]  - Dr. Neale Burly: [phone]  - Dr. Roseanne Reno: [phone]  In  the event of inclement weather, please call our main line at [phone] for an update on the status of any delays or closures.  Dermatology Medication Tips: Please keep the boxes that topical medications come in in order to help keep track of the instructions about where and how to use these. Pharmacies typically print the medication instructions only on the boxes and not directly on the medication tubes.   If your medication is too expensive, please contact our office at [phone] option 4 or send [place] a message through MyChart.   We are unable to  tell what your co-pay for medications will be in advance as this is different depending on your insurance coverage. However, we may be able to find a substitute medication at lower cost or fill out paperwork to get insurance to cover a needed medication.   If a prior authorization is required to get your medication covered by your insurance company, please allow [place] 1-2 business days to complete this process.  Drug prices often vary depending on where the prescription is filled and some pharmacies may offer cheaper prices.  The website www.goodrx.com contains coupons for medications through different pharmacies. The prices here do not account for what the cost may be with help from insurance (it may be cheaper with your insurance), but the website can give you the price if you did not use any insurance.  - You can print the associated coupon and take it with your prescription to the pharmacy.  - You may also stop by our office during regular business hours and pick up a GoodRx coupon card.  - If you need your prescription sent electronically to a different pharmacy, notify our office through [facility] or by phone at [phone] option 4.     Si Usted Necesita Algo Despus de Su Visita  Tambin puede enviarnos un mensaje a travs de Clinical cytogeneticist. Por lo general respondemos a los mensajes de MyChart en el transcurso de 1 a 2 das hbiles.  Para renovar recetas, por favor pida a su farmacia que se ponga en contacto con nuestra oficina. Annie Sable de fax es [place] [phone].  Si tiene un asunto urgente cuando la clnica est cerrada y que no puede esperar hasta el siguiente da hbil, puede llamar/localizar a su doctor(a) al nmero que aparece a continuacin.   Por favor, tenga en cuenta que aunque hacemos todo lo posible para estar disponibles para asuntos urgentes fuera del horario de [place], no estamos disponibles las 24 horas del da, los 7 [address]  [address] de la [place].   Si tiene un problema  urgente y no puede comunicarse con nosotros, puede optar por buscar atencin mdica  en el consultorio de su doctor(a), en una clnica privada, en un centro de atencin urgente o en una sala de emergencias.  Si tiene Engineer, drilling, por favor llame inmediatamente al 911 o vaya a la sala de emergencias.  Nmeros de bper  - Dr. Gwen Pounds: [phone]  - Dra. Moye: [phone]  - Dra. Roseanne Reno: [phone]  En caso de inclemencias del [place], por favor llame a Lacy Duverney principal al [phone] para una actualizacin sobre el [place] de cualquier retraso o cierre.  Consejos para la medicacin en dermatologa: Por favor, guarde las cajas en las que vienen los medicamentos de uso tpico para ayudarle a seguir las instrucciones sobre dnde y cmo usarlos. Las farmacias generalmente imprimen las instrucciones del medicamento slo en las cajas y no directamente en los tubos del [place].   Si  su medicamento es muy caro, por favor, pngase en contacto con Rolm Gala llamando al [phone] y presione la opcin 4 o envenos un mensaje a travs de Clinical cytogeneticist.   No podemos decirle cul ser su copago por los medicamentos por adelantado ya que esto es diferente dependiendo de la cobertura de su seguro. Sin embargo, es posible que podamos encontrar un medicamento sustituto a Audiological scientist un formulario para que el seguro cubra el medicamento que se considera necesario.   Si se requiere una autorizacin previa para que su compaa de seguros [place] su medicamento, por favor permtanos de 1 a 2 das hbiles para completar [address]  Los precios de los medicamentos varan con frecuencia dependiendo del Environmental consultant de dnde se surte la receta y alguna farmacias pueden ofrecer precios ms baratos.  El sitio web www.goodrx.com tiene cupones para medicamentos de Health and safety inspector. Los precios aqu no tienen en cuenta lo que podra costar con la ayuda del seguro (puede ser ms barato con  su seguro), pero el sitio web puede darle el precio si no utiliz Tourist information centre manager.  - Puede imprimir el cupn correspondiente y llevarlo con su receta a la farmacia.  - Tambin puede pasar por nuestra oficina durante el horario de atencin regular y Education officer, museum una tarjeta de cupones de GoodRx.  - Si necesita que su receta se enve electrnicamente a una farmacia diferente, informe a nuestra oficina a travs de MyChart de [place] o por telfono llamando al [phone] y presione la opcin 4.

## 2022-08-22 ENCOUNTER — Ambulatory Visit (INDEPENDENT_AMBULATORY_CARE_PROVIDER_SITE_OTHER): Payer: BC Managed Care – PPO | Admitting: Physician Assistant

## 2022-08-22 ENCOUNTER — Encounter: Payer: Self-pay | Admitting: Physician Assistant

## 2022-08-22 VITALS — BP 112/72 | HR 90 | Ht 61.0 in | Wt 175.9 lb

## 2022-08-22 DIAGNOSIS — K5901 Slow transit constipation: Secondary | ICD-10-CM

## 2022-08-22 DIAGNOSIS — M8580 Other specified disorders of bone density and structure, unspecified site: Secondary | ICD-10-CM

## 2022-08-22 DIAGNOSIS — R7303 Prediabetes: Secondary | ICD-10-CM

## 2022-08-22 DIAGNOSIS — R635 Abnormal weight gain: Secondary | ICD-10-CM | POA: Diagnosis not present

## 2022-08-22 DIAGNOSIS — E7800 Pure hypercholesterolemia, unspecified: Secondary | ICD-10-CM | POA: Diagnosis not present

## 2022-08-22 NOTE — Progress Notes (Signed)
Established patient visit   Patient: Patricia Kemp   DOB: 12-29-1960   62 y.o. Female  MRN: [id number] Visit Date: 08/22/2022  Today's healthcare provider: Alfredia Ferguson, PA-C   Cc. Osteopenia;    Subjective    HPI Discussed the use of AI scribe software for clinical note transcription with the patient, who gave verbal consent to proceed.  History of Present Illness   The patient, with a history of breast cancer and osteopenia, presents with concerns about recent weight gain. They report no changes in diet or exercise habits, and are unsure why they are gaining weight. They have been taking Metamucil to manage their constipation, but are unsure if it is working effectively. They also express concern about their increasing weight and its impact on their overall health.  In addition to these concerns, the patient discusses their recent bone scan results. They are currently on Fosamax and are curious about the duration of this treatment.       Medications: Outpatient Medications Prior to Visit  Medication Sig   alendronate (FOSAMAX) 70 MG tablet TAKE 1 TABLET (70 MG TOTAL) BY MOUTH ONCE A WEEK   atorvastatin (LIPITOR) 10 MG tablet TAKE 1 TABLET BY MOUTH EVERY DAY   Calcium Citrate-Vitamin D (CALCIUM CITRATE + D PO) Take 2 tablets by mouth daily. 630 mg/ 500 units   Cholecalciferol (VITAMIN D3) 125 MCG (5000 UT) TABS Take 5,000 Units by mouth daily.    DULoxetine (CYMBALTA) 30 MG capsule Take 1 capsule (30 mg total) by mouth daily.   lidocaine-prilocaine (EMLA) cream Apply 1 application topically as needed.   Multiple Vitamins-Minerals (WOMENS 50+ MULTI VITAMIN/MIN PO) Take 1 tablet by mouth daily.    Zinc 20 MG CAPS Take 20 mg by mouth daily.   No facility-administered medications prior to visit.     Objective    BP 112/72 (BP Location: Left Arm, Patient Position: Sitting, Cuff Size: Large)   Pulse 90   Ht 5\' 1"  (1.549 m)   Wt 175 lb 14.4 oz (79.8 kg)   SpO2 97%   BMI  33.24 kg/m   Physical Exam Vitals reviewed.  Constitutional:      Appearance: She is not ill-appearing.  HENT:     Head: Normocephalic.  Eyes:     Conjunctiva/sclera: Conjunctivae normal.  Cardiovascular:     Rate and Rhythm: Normal rate.  Pulmonary:     Effort: Pulmonary effort is normal. No respiratory distress.  Neurological:     General: No focal deficit present.     Mental Status: She is alert and oriented to person, place, and time.  Psychiatric:        Mood and Affect: Mood normal.        Behavior: Behavior normal.      No results found for any visits on 08/22/22.  Assessment & Plan     Problem List Items Addressed This Visit       Musculoskeletal and Integument   Osteopenia - Primary    Stable osteopenia currently managed with Fosamax since 2020. Recent bone scan showed good results. -Continue Fosamax for another year (through 2025). -Plan for next bone density scan in 2025 after discontinuation of Fosamax.  Cont calcium , vit d, weight bearing exercise        Other   Hyperlipidemia    Managed on atorvastatin 10 mg Fasting lipids ordered--but pt will come back as she is not fasting      Relevant Orders  Lipid Profile   Prediabetes    Repeat A1c for stability/improvement      Relevant Orders   HgB A1c   Other Visit Diagnoses     Weight gain       Relevant Orders   CBC w/Diff/Platelet   Comprehensive Metabolic Panel (CMET)   TSH + free T4   HgB A1c   Slow transit constipation           Unexplained Weight Gain: Recent weight gain with no significant changes in diet or exercise. Fatigue and constipation also reported.  Constipation: Chronic constipation, currently using Metamucil capsules with limited success. -Switch to Metamucil powder for more effective relief.         Return in about 6 months (around 02/22/2023) for CPE.      I, Alfredia Ferguson, PA-C have reviewed all documentation for this visit. The documentation on  08/22/22    for the exam, diagnosis, procedures, and orders are all accurate and complete.  Alfredia Ferguson, PA-C [facility] [address] [place], [place], [address] Office: [phone] Fax: [phone]   [facility]

## 2022-08-22 NOTE — Assessment & Plan Note (Signed)
Repeat A1c for stability/improvement

## 2022-08-22 NOTE — Assessment & Plan Note (Addendum)
Stable osteopenia currently managed with Fosamax since 2020. Recent bone scan showed good results. -Continue Fosamax for another year (through 2025). -Plan for next bone density scan in 2025 after discontinuation of Fosamax.  Cont calcium , vit d, weight bearing exercise

## 2022-08-22 NOTE — Assessment & Plan Note (Signed)
Managed on atorvastatin 10 mg Fasting lipids ordered--but pt will come back as she is not fasting 

## 2022-08-23 ENCOUNTER — Ambulatory Visit
Admission: RE | Admit: 2022-08-23 | Discharge: 2022-08-23 | Disposition: A | Discharge status: Home / Self Care | Payer: BC Managed Care – PPO | Source: Ambulatory Visit | Attending: Obstetrics and Gynecology | Admitting: Obstetrics and Gynecology

## 2022-08-23 DIAGNOSIS — Z853 Personal history of malignant neoplasm of breast: Secondary | ICD-10-CM | POA: Diagnosis not present

## 2022-08-23 DIAGNOSIS — Z1231 Encounter for screening mammogram for malignant neoplasm of breast: Secondary | ICD-10-CM | POA: Diagnosis not present

## 2022-08-25 DIAGNOSIS — R7303 Prediabetes: Secondary | ICD-10-CM | POA: Diagnosis not present

## 2022-08-25 DIAGNOSIS — R635 Abnormal weight gain: Secondary | ICD-10-CM | POA: Diagnosis not present

## 2022-08-25 DIAGNOSIS — E7800 Pure hypercholesterolemia, unspecified: Secondary | ICD-10-CM | POA: Diagnosis not present

## 2022-08-26 LAB — LIPID PANEL
Chol/HDL Ratio: 3.2 ratio (ref 0.0–4.4)
Cholesterol, Total: 191 mg/dL (ref 100–199)
HDL: 60 mg/dL (ref >39)
LDL Chol Calc (NIH): 111 mg/dL — ABNORMAL HIGH (ref 0–99)
Triglycerides: 110 mg/dL (ref 0–149)
VLDL Cholesterol Cal: 20 mg/dL (ref 5–40)

## 2022-08-26 LAB — COMPREHENSIVE METABOLIC PANEL
ALT: 32 IU/L (ref 0–32)
AST: 22 IU/L (ref 0–40)
Albumin: 4.3 g/dL (ref 3.9–4.9)
Alkaline Phosphatase: 119 IU/L (ref 44–121)
BUN/Creatinine Ratio: 15 (ref 12–28)
BUN: 12 mg/dL (ref 8–27)
Bilirubin Total: 0.6 mg/dL (ref 0.0–1.2)
CO2: 24 mmol/L (ref 20–29)
Calcium: 9.4 mg/dL (ref 8.7–10.3)
Chloride: 103 mmol/L (ref 96–106)
Creatinine, Ser: 0.79 mg/dL (ref 0.57–1.00)
Globulin, Total: 1.8 g/dL (ref 1.5–4.5)
Glucose: 92 mg/dL (ref 70–99)
Potassium: 4.2 mmol/L (ref 3.5–5.2)
Sodium: 144 mmol/L (ref 134–144)
Total Protein: 6.1 g/dL (ref 6.0–8.5)
eGFR: 83 mL/min/{1.73_m2} (ref >59)

## 2022-08-26 LAB — CBC WITH DIFFERENTIAL/PLATELET
Basophils Absolute: 0.1 10*3/uL (ref 0.0–0.2)
Basos: 1 %
EOS (ABSOLUTE): 0.1 10*3/uL (ref 0.0–0.4)
Eos: 2 %
Hematocrit: 39.1 % (ref 34.0–46.6)
Hemoglobin: 13 g/dL (ref 11.1–15.9)
Immature Grans (Abs): 0 10*3/uL (ref 0.0–0.1)
Immature Granulocytes: 0 %
Lymphocytes Absolute: 1.9 10*3/uL (ref 0.7–3.1)
Lymphs: 33 %
MCH: 29.3 pg (ref 26.6–33.0)
MCHC: 33.2 g/dL (ref 31.5–35.7)
MCV: 88 fL (ref 79–97)
Monocytes Absolute: 0.4 10*3/uL (ref 0.1–0.9)
Monocytes: 7 %
Neutrophils Absolute: 3.4 10*3/uL (ref 1.4–7.0)
Neutrophils: 57 %
Platelets: 237 10*3/uL (ref 150–450)
RBC: 4.43 x10E6/uL (ref 3.77–5.28)
RDW: 11.9 % (ref 11.7–15.4)
WBC: 5.9 10*3/uL (ref 3.4–10.8)

## 2022-08-26 LAB — HEMOGLOBIN A1C
Est. average glucose Bld gHb Est-mCnc: 128 mg/dL
Hgb A1c MFr Bld: 6.1 % — ABNORMAL HIGH (ref 4.8–5.6)

## 2022-08-26 LAB — TSH+FREE T4
Free T4: 1.02 ng/dL (ref 0.82–1.77)
TSH: 2.31 u[IU]/mL (ref 0.450–4.500)

## 2022-08-28 NOTE — Progress Notes (Signed)
Metformin 500 mg daily

## 2022-08-29 DIAGNOSIS — G4733 Obstructive sleep apnea (adult) (pediatric): Secondary | ICD-10-CM | POA: Diagnosis not present

## 2022-08-31 ENCOUNTER — Encounter: Payer: Self-pay | Admitting: Physician Assistant

## 2022-09-01 ENCOUNTER — Other Ambulatory Visit: Payer: Self-pay | Admitting: Physician Assistant

## 2022-09-01 ENCOUNTER — Other Ambulatory Visit: Payer: Self-pay | Admitting: Family Medicine

## 2022-09-01 DIAGNOSIS — E785 Hyperlipidemia, unspecified: Secondary | ICD-10-CM

## 2022-09-05 ENCOUNTER — Telehealth (INDEPENDENT_AMBULATORY_CARE_PROVIDER_SITE_OTHER): Payer: BC Managed Care – PPO | Admitting: Physician Assistant

## 2022-09-05 DIAGNOSIS — F32A Depression, unspecified: Secondary | ICD-10-CM | POA: Diagnosis not present

## 2022-09-05 DIAGNOSIS — E7849 Other hyperlipidemia: Secondary | ICD-10-CM | POA: Diagnosis not present

## 2022-09-05 DIAGNOSIS — R7303 Prediabetes: Secondary | ICD-10-CM | POA: Diagnosis not present

## 2022-09-05 DIAGNOSIS — F419 Anxiety disorder, unspecified: Secondary | ICD-10-CM

## 2022-09-05 MED ORDER — DULOXETINE HCL 30 MG PO CPEP
30.0000 mg | ORAL_CAPSULE | Freq: Every day | ORAL | 3 refills | Status: DC
Start: 2022-09-05 — End: 2023-08-07

## 2022-09-05 MED ORDER — METFORMIN HCL 500 MG PO TABS
500.0000 mg | ORAL_TABLET | Freq: Two times a day (BID) | ORAL | 0 refills | Status: DC
Start: 2022-09-05 — End: 2022-11-27

## 2022-09-05 NOTE — Progress Notes (Unsigned)
MyChart Video Visit  Virtual Visit via Video Note   This format is felt to be most appropriate for this patient at this time. Physical exam was limited by quality of the video and audio technology used for the visit.   Provider location: office Patient Location: Home  I discussed the limitations of evaluation and management by telemedicine and the availability of in person appointments. The patient expressed understanding and agreed to proceed.  Patient: Patricia Kemp   DOB: [birth year]   62 y.o. Female  MRN: [id number] Visit Date: 09/05/2022  Today's healthcare provider: Debera Lat, PA-C   Chief Complaint  Patient presents with   Medication Consultation    Patient was advised with recent lab results to start Metformin 500mg  daily and to schedule a visit to further discuss    Prediabetes   Subjective     Discussed the use of AI scribe software for clinical note transcription with the patient, who gave verbal consent to proceed.  History of Present Illness   The patient, with a history of elevated cholesterol levels, presents for a follow-up visit. She has been taking a statin (10 mg) for cholesterol management. Despite medication, her bad cholesterol remains slightly elevated at 111, with a target goal of less than 70. The patient's 10-year risk for heart attack or stroke is calculated at 3.6%, which is considered good. The patient is advised to continue the statin and maintain a low cholesterol, low fat diet.  In addition, her A1c level, a marker for diabetes, has increased from 5.9 to 6.1 over the past year. This places the patient close to the diabetic range (6.5). She is advised to start metformin to help manage her blood sugar levels and potentially aid in weight loss. The patient agrees to start the medication after returning from a trip to [place].  The patient also mentions a prescription for an antidepressant (Duloxetine) that needs to be sent to [facility] for a  three-month supply. She has been taking this medication for years and plans to continue it indefinitely.         Medications: Outpatient Medications Prior to Visit  Medication Sig   alendronate (FOSAMAX) 70 MG tablet TAKE 1 TABLET (70 MG TOTAL) BY MOUTH ONCE A WEEK   atorvastatin (LIPITOR) 10 MG tablet TAKE 1 TABLET BY MOUTH EVERY DAY   Calcium Citrate-Vitamin D (CALCIUM CITRATE + D PO) Take 2 tablets by mouth daily. 630 mg/ 500 units   Cholecalciferol (VITAMIN D3) 125 MCG (5000 UT) TABS Take 5,000 Units by mouth daily.    lidocaine-prilocaine (EMLA) cream Apply 1 application topically as needed.   Multiple Vitamins-Minerals (WOMENS 50+ MULTI VITAMIN/MIN PO) Take 1 tablet by mouth daily.    Zinc 20 MG CAPS Take 20 mg by mouth daily.   [DISCONTINUED] DULoxetine (CYMBALTA) 30 MG capsule Take 1 capsule (30 mg total) by mouth daily.   No facility-administered medications prior to visit.    Review of Systems Except see HPI   {Insert previous labs (optional):23779}  {See past labs  Heme  Chem  Endocrine  Serology  Results Review (optional):1}   Objective    There were no vitals taken for this visit.  {Insert last BP/Wt (optional):23777}  {See vitals history (optional):1}   Physical Exam     Assessment & Plan    Assessment and Plan    Hyperlipidemia: LDL elevated to 111, despite adherence to statin therapy (10 mcg). 10-year risk for heart attack or stroke calculated at 3.6, which is  below the threshold of concern (7.5). -Continue current statin therapy at 10 mcg daily. -Adhere to low cholesterol, low fat diet, emphasizing healthy fats such as olive oil, avocados, and nuts. -Encourage weight loss and regular physical activity.  Prediabetes: A1c increased from 5.9 to 6.1 over the past year, nearing the threshold for diabetes diagnosis (6.5). -Start Metformin upon return from upcoming trip to [place] in late August. -Continue low carb diet and regular physical  activity. -Check B12 levels due to potential depletion with long-term Metformin use. -Schedule follow-up appointment 6 weeks after starting Metformin to assess dosage adjustment needs.  Depression: Long-term use of Duloxetine. -Transition prescription to a 23-month supply through [facility] for convenience and cost-effectiveness.         ***  No follow-ups on file.     I discussed the assessment and treatment plan with the patient. The patient was provided an opportunity to ask questions and all were answered. The patient agreed with the plan and demonstrated an understanding of the instructions.   The patient was advised to call back or seek an in-person evaluation if the symptoms worsen or if the condition fails to improve as anticipated.  I provided *** minutes of non-face-to-face time during this encounter.  [facility]

## 2022-09-07 ENCOUNTER — Encounter: Payer: Self-pay | Admitting: Physician Assistant

## 2022-09-18 DIAGNOSIS — G4733 Obstructive sleep apnea (adult) (pediatric): Secondary | ICD-10-CM | POA: Diagnosis not present

## 2022-10-09 ENCOUNTER — Ambulatory Visit: Payer: BC Managed Care – PPO | Admitting: Physician Assistant

## 2022-10-17 DIAGNOSIS — G4733 Obstructive sleep apnea (adult) (pediatric): Secondary | ICD-10-CM | POA: Diagnosis not present

## 2022-11-10 DIAGNOSIS — G4733 Obstructive sleep apnea (adult) (pediatric): Secondary | ICD-10-CM | POA: Diagnosis not present

## 2022-11-25 ENCOUNTER — Encounter: Payer: Self-pay | Admitting: Physician Assistant

## 2022-11-25 DIAGNOSIS — R7303 Prediabetes: Secondary | ICD-10-CM

## 2022-11-27 MED ORDER — METFORMIN HCL 500 MG PO TABS
500.0000 mg | ORAL_TABLET | Freq: Two times a day (BID) | ORAL | 0 refills | Status: DC
Start: 2022-11-27 — End: 2022-12-12

## 2022-12-02 ENCOUNTER — Other Ambulatory Visit: Payer: Self-pay | Admitting: Physician Assistant

## 2022-12-02 DIAGNOSIS — M810 Age-related osteoporosis without current pathological fracture: Secondary | ICD-10-CM

## 2022-12-12 ENCOUNTER — Ambulatory Visit: Payer: BC Managed Care – PPO | Admitting: Family Medicine

## 2022-12-12 VITALS — BP 130/79 | HR 85 | Temp 98.8°F | Ht 61.0 in | Wt 172.0 lb

## 2022-12-12 DIAGNOSIS — J0140 Acute pansinusitis, unspecified: Secondary | ICD-10-CM

## 2022-12-12 DIAGNOSIS — R7303 Prediabetes: Secondary | ICD-10-CM | POA: Diagnosis not present

## 2022-12-12 MED ORDER — AZITHROMYCIN 500 MG PO TABS
500.0000 mg | ORAL_TABLET | Freq: Every day | ORAL | 0 refills | Status: DC
Start: 2022-12-12 — End: 2023-03-26

## 2022-12-12 MED ORDER — METFORMIN HCL ER 750 MG PO TB24
750.0000 mg | ORAL_TABLET | Freq: Every day | ORAL | 0 refills | Status: DC
Start: 2022-12-12 — End: 2023-04-05

## 2022-12-12 MED ORDER — METFORMIN HCL 500 MG PO TABS
500.0000 mg | ORAL_TABLET | Freq: Two times a day (BID) | ORAL | 3 refills | Status: DC
Start: 2022-12-12 — End: 2022-12-12

## 2022-12-12 MED ORDER — DOXYCYCLINE HYCLATE 100 MG PO TABS: 100.0000 mg | ORAL_TABLET | Freq: Two times a day (BID) | ORAL | 0 refills | Status: DC

## 2022-12-12 NOTE — Progress Notes (Signed)
Established patient visit   Patient: Patricia Kemp   DOB: 08-20-1960   62 y.o. Female  MRN: [id number] Visit Date: 12/12/2022  Today's healthcare provider: Jacky Kindle, FNP  Introduced to nurse practitioner role and practice setting.  All questions answered.  Discussed provider/patient relationship and expectations.  Subjective    Sinusitis   HPI     Sinusitis    Additional comments: Patient began having symptoms 1 week ago.  Sinus pain , pressure, fatigue, possible fever and post nasal drainage, ear fullness and sinus tenderness.       Last edited by Adline Peals, CMA on 12/12/2022  1:47 PM.      Medications: Outpatient Medications Prior to Visit  Medication Sig   atorvastatin (LIPITOR) 10 MG tablet TAKE 1 TABLET BY MOUTH EVERY DAY   Calcium Citrate-Vitamin D (CALCIUM CITRATE + D PO) Take 2 tablets by mouth daily. 630 mg/ 500 units   Cholecalciferol (VITAMIN D3) 125 MCG (5000 UT) TABS Take 5,000 Units by mouth daily.    DULoxetine (CYMBALTA) 30 MG capsule Take 1 capsule (30 mg total) by mouth daily.   lidocaine-prilocaine (EMLA) cream Apply 1 application topically as needed.   Multiple Vitamins-Minerals (WOMENS 50+ MULTI VITAMIN/MIN PO) Take 1 tablet by mouth daily.    Zinc 20 MG CAPS Take 20 mg by mouth daily.   [DISCONTINUED] metFORMIN (GLUCOPHAGE) 500 MG tablet Take 1 tablet (500 mg total) by mouth 2 (two) times daily with a meal.   [DISCONTINUED] metFORMIN (GLUCOPHAGE) 500 MG tablet Take 1 tablet (500 mg total) by mouth 2 (two) times daily with a meal.   No facility-administered medications prior to visit.     Objective    BP 130/79 (BP Location: Left Arm, Patient Position: Sitting, Cuff Size: Normal)   Pulse 85   Temp 98.8 F (37.1 C) (Oral)   Ht 5\' 1"  (1.549 m)   Wt 172 lb (78 kg)   SpO2 100%   BMI 32.50 kg/m   Physical Exam Vitals and nursing note reviewed.  Constitutional:      General: She is not in acute distress.    Appearance: Normal  appearance. She is obese. She is not ill-appearing, toxic-appearing or diaphoretic.  HENT:     Head: Normocephalic and atraumatic.     Right Ear: Tympanic membrane, ear canal and external ear normal.     Left Ear: Tympanic membrane, ear canal and external ear normal.     Nose:     Right Sinus: Maxillary sinus tenderness and frontal sinus tenderness present.     Left Sinus: Maxillary sinus tenderness and frontal sinus tenderness present.     Mouth/Throat:     Mouth: Mucous membranes are moist.     Pharynx: Oropharynx is clear. No oropharyngeal exudate or posterior oropharyngeal erythema.  Eyes:     Extraocular Movements: Extraocular movements intact.     Conjunctiva/sclera: Conjunctivae normal.  Cardiovascular:     Rate and Rhythm: Normal rate and regular rhythm.     Pulses: Normal pulses.     Heart sounds: Normal heart sounds. No murmur heard.    No friction rub. No gallop.  Pulmonary:     Effort: Pulmonary effort is normal. No respiratory distress.     Breath sounds: Normal breath sounds. No stridor. No wheezing, rhonchi or rales.  Chest:     Chest wall: No tenderness.  Musculoskeletal:        General: No swelling, tenderness, deformity or signs of injury. Normal  range of motion.     Right lower leg: No edema.     Left lower leg: No edema.  Skin:    General: Skin is warm and dry.     Capillary Refill: Capillary refill takes less than 2 seconds.     Coloration: Skin is not jaundiced or pale.     Findings: No bruising, erythema, lesion or rash.  Neurological:     General: No focal deficit present.     Mental Status: She is alert and oriented to person, place, and time. Mental status is at baseline.     Cranial Nerves: No cranial nerve deficit.     Sensory: No sensory deficit.     Motor: No weakness.     Coordination: Coordination normal.  Psychiatric:        Mood and Affect: Mood normal.        Behavior: Behavior normal.        Thought Content: Thought content normal.         Judgment: Judgment normal.     No results found for any visits on 12/12/22.  Assessment & Plan     Problem List Items Addressed This Visit       Respiratory   Acute non-recurrent pansinusitis - Primary    Acute, works p/t as Runner, broadcasting/film/video Recommend treatment given failed OTC/lingering symptoms Advised of supportive OTC meds that are listed as 'BP safe' Return as needed; denied work note d/t nature of p/t work      Relevant Medications   doxycycline (VIBRA-TABS) 100 MG tablet   azithromycin (ZITHROMAX) 500 MG tablet     Other   Prediabetes    Chronic; reports GI symptoms severe with IR metformin; recommend trial of 750 XR in place of 500 mg IR BID Continue to recommend balanced, lower carb meals. Smaller meal size, adding snacks. Choosing water as drink of choice and increasing purposeful exercise. Plan on A1c q6 months to assist      Relevant Medications   metFORMIN (GLUCOPHAGE-XR) 750 MG 24 hr tablet   No follow-ups on file.     Leilani Merl, FNP, have reviewed all documentation for this visit. The documentation on 12/12/22 for the exam, diagnosis, procedures, and orders are all accurate and complete.  Jacky Kindle, FNP  [facility] [phone] (phone) [phone] (fax)  [facility]

## 2022-12-12 NOTE — Addendum Note (Signed)
Addended by: Debera Lat on: 12/12/2022 01:07 PM   Modules accepted: Orders

## 2022-12-12 NOTE — Assessment & Plan Note (Signed)
Chronic; reports GI symptoms severe with IR metformin; recommend trial of 750 XR in place of 500 mg IR BID Continue to recommend balanced, lower carb meals. Smaller meal size, adding snacks. Choosing water as drink of choice and increasing purposeful exercise. Plan on A1c q6 months to assist

## 2022-12-12 NOTE — Assessment & Plan Note (Signed)
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

## 2022-12-26 DIAGNOSIS — G8929 Other chronic pain: Secondary | ICD-10-CM | POA: Diagnosis not present

## 2022-12-26 DIAGNOSIS — M25562 Pain in left knee: Secondary | ICD-10-CM | POA: Diagnosis not present

## 2023-01-14 ENCOUNTER — Encounter: Payer: Self-pay | Admitting: Family Medicine

## 2023-01-15 ENCOUNTER — Other Ambulatory Visit: Payer: Self-pay

## 2023-01-16 MED ORDER — ALENDRONATE SODIUM 70 MG PO TABS: 70.0000 mg | ORAL_TABLET | ORAL | 11 refills | Status: AC

## 2023-02-09 DIAGNOSIS — G4733 Obstructive sleep apnea (adult) (pediatric): Secondary | ICD-10-CM | POA: Diagnosis not present

## 2023-03-05 ENCOUNTER — Telehealth: Payer: Self-pay | Admitting: Physician Assistant

## 2023-03-05 NOTE — Telephone Encounter (Signed)
Needs follow-up appt w/ A1c and refills

## 2023-03-05 NOTE — Telephone Encounter (Signed)
Appt made for 03/26/23 with Dr. Payton Mccallum and patient advised.

## 2023-03-05 NOTE — Telephone Encounter (Signed)
[facility] faxed refill request for the following medications:   metFORMIN (GLUCOPHAGE-XR) 750 MG 24 hr tablet     Please advise.

## 2023-03-16 DIAGNOSIS — M1712 Unilateral primary osteoarthritis, left knee: Secondary | ICD-10-CM | POA: Diagnosis not present

## 2023-03-20 ENCOUNTER — Telehealth: Payer: Self-pay | Admitting: Family Medicine

## 2023-03-20 DIAGNOSIS — E785 Hyperlipidemia, unspecified: Secondary | ICD-10-CM

## 2023-03-20 MED ORDER — ATORVASTATIN CALCIUM 10 MG PO TABS
10.0000 mg | ORAL_TABLET | Freq: Every day | ORAL | 1 refills | Status: DC
Start: 2023-03-20 — End: 2023-10-17

## 2023-03-20 NOTE — Telephone Encounter (Signed)
[facility] is requesting refill atorvastatin (LIPITOR) 10 MG tablet  Please advise

## 2023-03-26 ENCOUNTER — Ambulatory Visit: Payer: BC Managed Care – PPO | Admitting: Family Medicine

## 2023-03-26 ENCOUNTER — Encounter: Payer: Self-pay | Admitting: Family Medicine

## 2023-03-26 VITALS — BP 116/73 | HR 92 | Ht 61.0 in | Wt 173.0 lb

## 2023-03-26 DIAGNOSIS — J301 Allergic rhinitis due to pollen: Secondary | ICD-10-CM

## 2023-03-26 DIAGNOSIS — R7303 Prediabetes: Secondary | ICD-10-CM

## 2023-03-26 DIAGNOSIS — B351 Tinea unguium: Secondary | ICD-10-CM

## 2023-03-26 DIAGNOSIS — J302 Other seasonal allergic rhinitis: Secondary | ICD-10-CM | POA: Insufficient documentation

## 2023-03-26 MED ORDER — CICLOPIROX 8 % EX SOLN
Freq: Every day | CUTANEOUS | 0 refills | Status: DC
Start: 2023-03-26 — End: 2023-07-16

## 2023-03-26 NOTE — Assessment & Plan Note (Signed)
 Currently on extended-release metformin  since October 2024 due to gastrointestinal side effects with the immediate-release formulation. Reports good tolerance with occasional missed doses. No hypoglycemia symptoms. A1c levels to be checked to assess current status. Discussed needle phobia and plan for blood draw with arm numbing. - Order basic metabolic panel and A1c - Advise return for blood draw with arm numbing

## 2023-03-26 NOTE — Assessment & Plan Note (Signed)
 Symptoms well-controlled with daily Claritin during allergy season. Advised to continue regimen. - Continue daily Claritin during allergy season

## 2023-03-26 NOTE — Progress Notes (Signed)
 Established patient visit   Patient: Patricia Kemp   DOB: [birth year]   63 y.o. Female  MRN: [id number] Visit Date: 03/26/2023  Today's healthcare provider: Carlean Charter, DO   Chief Complaint  Patient presents with   Diabetes    Admits to sometimes forgetting to take her medication at dinner   Subjective    HPI Last annual: 09/17/2021 Last OBGYN Annual: 07/11/2022  Patricia Kemp is a 63 year old female with prediabetes who presents for follow-up regarding her blood sugar management.  She started metformin  in September 2024 after a trip to [place]. Initially, she experienced gastrointestinal side effects, including diarrhea, which improved after switching to the extended-release formulation in October 2024. She takes metformin  daily with her largest meal but occasionally forgets when dining out. No symptoms of hypoglycemia such as feeling woozy, lightheaded, or clammy. She has not had a recent A1c test since starting metformin , and her previous A1c levels were without the medication. She has a needle phobia and prefers to numb her arm before blood draws.  She reports a new issue with her toenails, noting that they are starting to come unattached from her toes, particularly the big toes and little toes. There is no change in color or thickness, and she has not changed her diet or soap. She only gets pedicures in the summer.  She has a history of sinus issues, which she manages with Claritin, especially during the allergy season. She has been taking Claritin daily during the season since moving from [place]  to her current location in 2020.  She moved from [place] to her current location in 2020 after retirement. She is married and her husband is involved in the medical business.     Medications: Outpatient Medications Prior to Visit  Medication Sig   alendronate  (FOSAMAX ) 70 MG tablet Take 1 tablet (70 mg total) by mouth every 7 (seven) days. Take with a full glass of water  on an empty stomach.   atorvastatin  (LIPITOR) 10 MG tablet Take 1 tablet (10 mg total) by mouth daily.   Calcium  Citrate-Vitamin D (CALCIUM  CITRATE + D PO) Take 2 tablets by mouth daily. 630 mg/ 500 units   Cholecalciferol (VITAMIN D3) 125 MCG (5000 UT) TABS Take 5,000 Units by mouth daily.    DULoxetine  (CYMBALTA ) 30 MG capsule Take 1 capsule (30 mg total) by mouth daily.   lidocaine -prilocaine  (EMLA ) cream Apply 1 application topically as needed.   metFORMIN  (GLUCOPHAGE -XR) 750 MG 24 hr tablet Take 1 tablet (750 mg total) by mouth daily with breakfast. Daily with largest meal   Multiple Vitamins-Minerals (WOMENS 50+ MULTI VITAMIN/MIN PO) Take 1 tablet by mouth daily.    Zinc 20 MG CAPS Take 20 mg by mouth daily.   [DISCONTINUED] azithromycin  (ZITHROMAX ) 500 MG tablet Take 1 tablet (500 mg total) by mouth daily.   [DISCONTINUED] doxycycline  (VIBRA -TABS) 100 MG tablet Take 1 tablet (100 mg total) by mouth 2 (two) times daily.   No facility-administered medications prior to visit.    Review of Systems  Constitutional:  Negative for appetite change, chills, fatigue and fever.  Respiratory:  Negative for chest tightness and shortness of breath.   Cardiovascular:  Negative for chest pain and palpitations.  Gastrointestinal:  Negative for abdominal pain, nausea and vomiting.  Neurological:  Negative for dizziness, weakness and headaches.        Objective    BP 116/73   Pulse 92   Ht 5\' 1"  (1.549  m)   Wt 173 lb (78.5 kg)   BMI 32.69 kg/m     Physical Exam Constitutional:      Appearance: Normal appearance.  HENT:     Head: Normocephalic and atraumatic.  Eyes:     General: No scleral icterus.    Extraocular Movements: Extraocular movements intact.     Conjunctiva/sclera: Conjunctivae normal.  Cardiovascular:     Rate and Rhythm: Normal rate and regular rhythm.     Pulses: Normal pulses.     Heart sounds: Normal heart sounds.  Pulmonary:     Effort: Pulmonary effort is  normal. No respiratory distress.     Breath sounds: Normal breath sounds.  Abdominal:     General: Bowel sounds are normal. There is no distension.     Palpations: Abdomen is soft. There is no mass.     Tenderness: There is no abdominal tenderness. There is no guarding.  Musculoskeletal:     Right lower leg: No edema.     Left lower leg: No edema.  Skin:    General: Skin is warm and dry.  Neurological:     Mental Status: She is alert and oriented to person, place, and time. Mental status is at baseline.  Psychiatric:        Mood and Affect: Mood normal.        Behavior: Behavior normal.      No results found for any visits on 03/26/23.  Assessment & Plan    Prediabetes Assessment & Plan: Currently on extended-release metformin  since October 2024 due to gastrointestinal side effects with the immediate-release formulation. Reports good tolerance with occasional missed doses. No hypoglycemia symptoms. A1c levels to be checked to assess current status. Discussed needle phobia and plan for blood draw with arm numbing. - Order basic metabolic panel and A1c - Advise return for blood draw with arm numbing  Orders: -     Basic metabolic panel -     Hemoglobin A1c  Onychomycosis Assessment & Plan: Toenails, particularly big and little toes, are detaching without thickness change but with mild yellowing noted to great toenail. Mild thickening of medial left 5th toenail. No recent changes in diet or soap. Differential includes fungal infection. Discussed antifungal treatment options including ciclopirox  topical solution. - Prescribe ciclopirox  topical solution - Consider podiatry referral if no improvement  Orders: -     Ciclopirox ; Apply topically at bedtime. Apply over nail and surrounding skin. Apply daily over previous coat. After seven (7) days, may remove with alcohol and continue cycle.  Dispense: 6.6 mL; Refill: 0  Seasonal allergic rhinitis due to pollen Assessment &  Plan: Symptoms well-controlled with daily Claritin during allergy season. Advised to continue regimen. - Continue daily Claritin during allergy season    General Health Maintenance Last physical in July 2024. Plans to travel to [place]  in July 2025. - Schedule follow-up appointment for early July 2025.   Return in about 21 weeks (around 08/20/2023) for CPE.      I discussed the assessment and treatment plan with the patient  The patient was provided an opportunity to ask questions and all were answered. The patient agreed with the plan and demonstrated an understanding of the instructions.   The patient was advised to call back or seek an in-person evaluation if the symptoms worsen or if the condition fails to improve as anticipated.    Carlean Charter, DO  [facility] [phone] (phone) [phone] (fax)  [facility]

## 2023-03-26 NOTE — Assessment & Plan Note (Signed)
 Toenails, particularly big and little toes, are detaching without thickness change but with mild yellowing noted to great toenail. Mild thickening of medial left 5th toenail. No recent changes in diet or soap. Differential includes fungal infection. Discussed antifungal treatment options including ciclopirox  topical solution. - Prescribe ciclopirox  topical solution - Consider podiatry referral if no improvement

## 2023-03-27 DIAGNOSIS — R7303 Prediabetes: Secondary | ICD-10-CM | POA: Diagnosis not present

## 2023-03-28 LAB — BASIC METABOLIC PANEL
BUN/Creatinine Ratio: 16 (ref 12–28)
BUN: 13 mg/dL (ref 8–27)
CO2: 24 mmol/L (ref 20–29)
Calcium: 9.1 mg/dL (ref 8.7–10.3)
Chloride: 105 mmol/L (ref 96–106)
Creatinine, Ser: 0.8 mg/dL (ref 0.57–1.00)
Glucose: 89 mg/dL (ref 70–99)
Potassium: 4.2 mmol/L (ref 3.5–5.2)
Sodium: 144 mmol/L (ref 134–144)
eGFR: 82 mL/min/{1.73_m2} (ref >59)

## 2023-03-28 LAB — HEMOGLOBIN A1C
Est. average glucose Bld gHb Est-mCnc: 128 mg/dL
Hgb A1c MFr Bld: 6.1 % — ABNORMAL HIGH (ref 4.8–5.6)

## 2023-04-02 ENCOUNTER — Encounter: Payer: Self-pay | Admitting: Family Medicine

## 2023-04-02 DIAGNOSIS — R7303 Prediabetes: Secondary | ICD-10-CM

## 2023-04-05 ENCOUNTER — Other Ambulatory Visit: Payer: Self-pay | Admitting: Family Medicine

## 2023-04-05 ENCOUNTER — Encounter: Payer: Self-pay | Admitting: Family Medicine

## 2023-04-05 DIAGNOSIS — R7303 Prediabetes: Secondary | ICD-10-CM

## 2023-04-05 MED ORDER — METFORMIN HCL ER 750 MG PO TB24
750.0000 mg | ORAL_TABLET | Freq: Every day | ORAL | 3 refills | Status: AC
Start: 2023-04-05 — End: ?

## 2023-04-05 NOTE — Addendum Note (Signed)
Addended by: Jacquenette Shone on: 04/05/2023 03:00 PM   Modules accepted: Orders

## 2023-04-06 NOTE — Telephone Encounter (Signed)
Requested medication (s) are due for refill today: No  Requested medication (s) are on the active medication list: Yes  Last refill:  04/05/23  Future visit scheduled: No  Notes to clinic:  Pharmacy requesting clarification: with breakfast or with Largest meal, please confirm correct script sig      Requested Prescriptions  Pending Prescriptions Disp Refills   metFORMIN (GLUCOPHAGE-XR) 750 MG 24 hr tablet [Pharmacy Med Name: METFORMIN HCL ER 750 MG TABLET] 90 tablet 3    Sig: TAKE 1 TABLET BY MOUTH EVERY DAY WITH LARGEST MEAL     Endocrinology:  Diabetes - Biguanides Failed - 04/06/2023 12:25 PM      Failed - B12 Level in normal range and within 720 days    Vitamin B-12  Date Value Ref Range Status  10/06/2020 643 232 - 1,245 pg/mL Final         Passed - Cr in normal range and within 360 days    Creatinine, Ser  Date Value Ref Range Status  03/27/2023 0.80 0.57 - 1.00 mg/dL Final         Passed - HBA1C is between 0 and 7.9 and within 180 days    Hgb A1c MFr Bld  Date Value Ref Range Status  03/27/2023 6.1 (H) 4.8 - 5.6 % Final    Comment:             Prediabetes: 5.7 - 6.4          Diabetes: >6.4          Glycemic control for adults with diabetes: <7.0          Passed - eGFR in normal range and within 360 days    GFR calc Af Amer  Date Value Ref Range Status  08/14/2019 93 >59 mL/min/1.73 Final    Comment:    **[facility] currently reports eGFR in compliance with the current**   recommendations of the [employer]. [facility] will   update reporting as new guidelines are published from the NKF-ASN   Task force.    GFR calc non Af Amer  Date Value Ref Range Status  08/14/2019 81 >59 mL/min/1.73 Final   eGFR  Date Value Ref Range Status  03/27/2023 82 >59 mL/min/1.73 Final         Passed - Valid encounter within last 6 months    Recent Outpatient Visits           3 months ago Acute non-recurrent pansinusitis   [facility] Jacky Kindle, FNP   7 months ago Prediabetes   [facility] [place], [place], PA-C   7 months ago Osteopenia, unspecified location   [facility] Alfredia Ferguson, PA-C   1 year ago Acute pain of right shoulder   [facility] Alfredia Ferguson, PA-C   1 year ago Annual physical exam   [facility] Alfredia Ferguson, PA-C       Future Appointments             In 6 months Willeen Niece, MD [facility]            Passed - CBC within normal limits and completed in the last 12 months    WBC  Date Value Ref Range Status  08/25/2022 5.9 3.4 - 10.8 x10E3/uL Final  01/10/2021 9.2 4.0 - 10.5 K/uL Final   RBC  Date Value Ref Range Status  08/25/2022  4.43 3.77 - 5.28 x10E6/uL Final  01/10/2021 4.45 3.87 - 5.11 MIL/uL Final   Hemoglobin  Date Value Ref Range Status  08/25/2022 13.0 11.1 - 15.9 g/dL Final   Hematocrit  Date Value Ref Range Status  08/25/2022 39.1 34.0 - 46.6 % Final   MCHC  Date Value Ref Range Status  08/25/2022 33.2 31.5 - 35.7 g/dL Final  16/11/9602 54.0 30.0 - 36.0 g/dL Final   [facility]  Date Value Ref Range Status  08/25/2022 29.3 26.6 - 33.0 pg Final  01/10/2021 28.8 26.0 - 34.0 pg Final   MCV  Date Value Ref Range Status  08/25/2022 88 79 - 97 fL Final   No results found for: "PLTCOUNTKUC", "LABPLAT", "POCPLA" RDW  Date Value Ref Range Status  08/25/2022 11.9 11.7 - 15.4 % Final

## 2023-04-23 DIAGNOSIS — H903 Sensorineural hearing loss, bilateral: Secondary | ICD-10-CM | POA: Diagnosis not present

## 2023-04-23 DIAGNOSIS — J329 Chronic sinusitis, unspecified: Secondary | ICD-10-CM | POA: Diagnosis not present

## 2023-04-23 DIAGNOSIS — R42 Dizziness and giddiness: Secondary | ICD-10-CM | POA: Diagnosis not present

## 2023-04-24 DIAGNOSIS — H8111 Benign paroxysmal vertigo, right ear: Secondary | ICD-10-CM | POA: Diagnosis not present

## 2023-05-01 DIAGNOSIS — H8111 Benign paroxysmal vertigo, right ear: Secondary | ICD-10-CM | POA: Diagnosis not present

## 2023-05-10 DIAGNOSIS — G4733 Obstructive sleep apnea (adult) (pediatric): Secondary | ICD-10-CM | POA: Diagnosis not present

## 2023-07-10 NOTE — Progress Notes (Signed)
 PCP: Carlean Charter, DO   Chief Complaint  Patient presents with   Gynecologic Exam    No concerns    HPI:      [name] is a 63 y.o. No obstetric history on file. whose LMP was No LMP recorded. Patient is postmenopausal., presents today for her annual examination.  Her menses are absent due to menopause. She just sweats a lot in general; no VS sx. S/p D&C 12/22 for PMB/endometrial thickening with Dr. Raquel Cables with neg pathology. No PMB since; no pelvic pain.   Sex activity: rare IC; usually oral sex; uses lubricants if has IC with some relief. USE SMALL SPECULUM on exam  Last Pap: 11/08/20  Results were: no abnormalities /neg HPV DNA.  Hx of STDs: none  Last mammogram: 08/23/22 Results were: normal--routine follow-up in 12 months There is a FH of breast cancer in her mom. There is no FH of ovarian cancer. Sister with colon cancer. The patient does self-breast exams. She has a hx of RT breast cancer age 89. Did lumpectomy, radiation and treated with tamoxifen for 5 yrs. No longer doing tx or seeing oncology. Pt had neg MyRisk testing 10/22. Had neg breast MRI 3/23 for screening; not needed yearly as preventive. Taking Vit D supp.   Colonoscopy: 2018 in [place] and 6/23 with [facility] with polyp; Repeat due after 7 years (pt wants to do after 5 yrs due to [facility]). FH colon cancer in her sister.   DEXA: 2019 with osteoporosis in spine and osteopenia in hip; treating with fosamax  with PC; will finish this yr. DEXA done 10/21 and 6/24 with PCP with osteopenia in spine and hip.   Tobacco use: The patient denies current or previous tobacco use. Alcohol use: rare No drug use Exercise: moderately active  She does get adequate calcium  and Vitamin D in her diet.  Labs with PCP.   Past Medical History:  Diagnosis Date   Allergy    Arthritis    Bleeds easily (HCC)    PT STATES IT TOOK HER A LITTLE LONGER TO STOP BLEEDING AFTER SINUS SURGERY   BRCA negative 11/2020   MyRisk neg except  SDHA VUS   Cancer (HCC) 09/2010   Right breast cancer   Depression    Dysplastic nevus 08/25/2020   R mid sternum, mod to severe atypia   Dysplastic nevus 08/25/2020   L pretibia, mod to severe atypia   Dysplastic nevus 08/25/2020   L calf, mod to severe atypia   Dysrhythmia    H/O IRREGULAR HEART BEAT-ASYMPTOMATIC   Headache    MIGRAINES   Heart murmur    ASYMPTOMATIC   HLD (hyperlipidemia)    Osteoporosis    in spine   Sleep apnea    NO CPAP-UNABLE TO USE DUE TO SINUS ISSUES    Past Surgical History:  Procedure Laterality Date   BREAST LUMPECTOMY Right 2012   hx of breast ca rad only   BREAST SURGERY Right 09/2010   CESAREAN SECTION     COLONOSCOPY     COLONOSCOPY WITH PROPOFOL  N/A 07/28/2021   Procedure: COLONOSCOPY WITH PROPOFOL ;  Surgeon: Luke Salaam, MD;  Location[facility] ENDOSCOPY;  Service: Gastroenterology;  Laterality: N/A;   DILATION AND CURETTAGE OF UTERUS N/A 01/13/2021   Procedure: DILATATION AND CURETTAGE;  Surgeon: Alben Alma, MD;  Location: [facility] ORS;  Service: Gynecology;  Laterality: N/A;   JOINT REPLACEMENT     RIGHT KNEE   KNEE ARTHROSCOPY Left 10/29/2019  Procedure: ARTHROSCOPY KNEE;  Surgeon: Arlyne Lame, MD;  Location: [facility] ORS;  Service: Orthopedics;  Laterality: Left;   NASAL SINUS SURGERY  2008    Family History  Problem Relation Age of Onset   Breast cancer Mother 20       Breast and lung   Cancer Mother    Skin cancer Father        forehead   Arthritis Father    Hearing loss Father    Vision loss Father    Depression Sister    Cancer Paternal Uncle    Cancer Paternal Grandfather        bone   Arthritis Paternal Grandmother     Social History   Socioeconomic History   Marital status: Married    Spouse name: Josiah Nigh   Number of children: Not on file   Years of education: Not on file   Highest education level: Master's degree (e.g., MA, MS, MEng, MEd, MSW, MBA)  Occupational History   Not on file  Tobacco Use   Smoking  status: Never   Smokeless tobacco: Never  Vaping Use   Vaping status: Never Used  Substance and Sexual Activity   Alcohol use: Yes    Alcohol/week: 1.0 standard drink of alcohol    Types: 1 Glasses of wine per week    Comment: rare   Drug use: Never   Sexual activity: Yes    Comment: ha, ha, ha!  Other Topics Concern   Not on file  Social History Narrative   Not on file   Social Drivers of Health   Financial Resource Strain: Low Risk  (03/16/2023)   [facility]   Overall Financial Resource Strain (CARDIA)    Difficulty of Paying Living Expenses: Not hard at all  Food Insecurity: No Food Insecurity (03/16/2023)   [facility]   Hunger Vital Sign    Worried About Running Out of Food in the Last Year: Never true    Ran Out of Food in the Last Year: Never true  Transportation Needs: No Transportation Needs (03/16/2023)   Received [facility] - [facility]    In the past 12 months, has lack of transportation kept you from medical appointments or from getting medications?: No    Lack of Transportation (Non-Medical): No  Physical Activity: Insufficiently Active (08/21/2022)   Exercise Vital Sign    Days of Exercise per Week: 1 day    Minutes of Exercise per Session: 20 min  Stress: No Stress Concern Present (08/21/2022)   [employer] of Occupational Health - Occupational Stress Questionnaire    Feeling of Stress : Not at all  Social Connections: Unknown (08/21/2022)   Social Connection and Isolation Panel [NHANES]    Frequency of Communication with Friends and Family: More than three times a week    Frequency of Social Gatherings with Friends and Family: Three times a week    Attends Religious Services: Patient declined    Active Member of Clubs or Organizations: Patient declined    Attends Banker Meetings: Not on file    Marital Status: Married  Catering manager  Violence: Not on file     Current Outpatient Medications:    alendronate  (FOSAMAX ) 70 MG tablet, Take 1 tablet (70 mg total) by mouth every 7 (seven) days. Take with a full glass of water on an empty stomach., Disp: 4 tablet, Rfl: 11   atorvastatin  (  LIPITOR) 10 MG tablet, Take 1 tablet (10 mg total) by mouth daily., Disp: 90 tablet, Rfl: 1   Calcium  Citrate-Vitamin D (CALCIUM  CITRATE + D PO), Take 2 tablets by mouth daily. 630 mg/ 500 units, Disp: , Rfl:    Cholecalciferol (VITAMIN D3) 125 MCG (5000 UT) TABS, Take 5,000 Units by mouth daily. , Disp: , Rfl:    DULoxetine  (CYMBALTA ) 30 MG capsule, Take 1 capsule (30 mg total) by mouth daily., Disp: 90 capsule, Rfl: 3   lidocaine -prilocaine  (EMLA ) cream, Apply 1 application topically as needed., Disp: 30 g, Rfl: 0   metFORMIN  (GLUCOPHAGE -XR) 750 MG 24 hr tablet, Take 1 tablet (750 mg total) by mouth daily with breakfast. Daily with largest meal, Disp: 90 tablet, Rfl: 3   Multiple Vitamins-Minerals (WOMENS 50+ MULTI VITAMIN/MIN PO), Take 1 tablet by mouth daily. , Disp: , Rfl:    Zinc 20 MG CAPS, Take 20 mg by mouth daily., Disp: , Rfl:      ROS:  Review of Systems  Constitutional:  Negative for fatigue, fever and unexpected weight change.  Respiratory:  Negative for cough, shortness of breath and wheezing.   Cardiovascular:  Negative for chest pain, palpitations and leg swelling.  Gastrointestinal:  Negative for blood in stool, constipation, diarrhea, nausea and vomiting.  Endocrine: Negative for cold intolerance, heat intolerance and polyuria.  Genitourinary:  Negative for dyspareunia, dysuria, flank pain, frequency, genital sores, hematuria, menstrual problem, pelvic pain, urgency, vaginal bleeding, vaginal discharge and vaginal pain.  Musculoskeletal:  Negative for arthralgias, back pain, joint swelling and myalgias.  Skin:  Negative for rash.  Neurological:  Negative for dizziness, syncope, light-headedness, numbness and headaches.   Hematological:  Negative for adenopathy.  Psychiatric/Behavioral:  Negative for agitation, confusion, sleep disturbance and suicidal ideas. The patient is not nervous/anxious.   BREAST: No symptoms    Objective: BP 134/84   Pulse 88   Ht 5' 0.5" (1.537 m)   Wt 175 lb (79.4 kg)   BMI 33.61 kg/m    Physical Exam Constitutional:      Appearance: She is well-developed.  Genitourinary:     Vulva normal.     Right Labia: No rash, tenderness or lesions.    Left Labia: No tenderness, lesions or rash.    No vaginal discharge, erythema or tenderness.     Moderate vaginal atrophy present.     Right Adnexa: not tender and no mass present.    Left Adnexa: not tender and no mass present.    No cervical friability or polyp.     Uterus is not enlarged or tender.  Breasts:    Right: No mass, nipple discharge, skin change or tenderness.     Left: No mass, nipple discharge, skin change or tenderness.  Neck:     Thyroid: No thyromegaly.  Cardiovascular:     Rate and Rhythm: Normal rate and regular rhythm.     Heart sounds: Normal heart sounds. No murmur heard. Pulmonary:     Effort: Pulmonary effort is normal.     Breath sounds: Normal breath sounds.  Abdominal:     Palpations: Abdomen is soft.     Tenderness: There is no abdominal tenderness. There is no guarding or rebound.  Musculoskeletal:        General: Normal range of motion.     Cervical back: Normal range of motion.  Lymphadenopathy:     Cervical: No cervical adenopathy.  Neurological:     General: No focal deficit present.  Mental Status: She is alert and oriented to person, place, and time.     Cranial Nerves: No cranial nerve deficit.  Skin:    General: Skin is warm and dry.  Psychiatric:        Mood and Affect: Mood normal.        Behavior: Behavior normal.        Thought Content: Thought content normal.        Judgment: Judgment normal.  Vitals reviewed.     Assessment/Plan: Encounter for annual routine  gynecological examination  Encounter for screening mammogram for malignant neoplasm of breast - Plan: MM 3D SCREENING MAMMOGRAM BILATERAL BREAST; pt to schedule mammo  History of breast cancer - Plan: MM 3D SCREENING MAMMOGRAM BILATERAL BREAST; no longer doing f/u; cont yearly mammo and SBE. F/u prn.         GYN counsel breast self exam, mammography screening, menopause, adequate intake of calcium  and vitamin D, diet and exercise    F/U  Return in about 1 year (around 07/15/2024).  Kadey Mihalic B. Romeo Zielinski, PA-C 07/16/2023 1:37 PM

## 2023-07-16 ENCOUNTER — Encounter: Payer: Self-pay | Admitting: Obstetrics and Gynecology

## 2023-07-16 ENCOUNTER — Ambulatory Visit (INDEPENDENT_AMBULATORY_CARE_PROVIDER_SITE_OTHER): Payer: Self-pay | Admitting: Obstetrics and Gynecology

## 2023-07-16 VITALS — BP 134/84 | HR 88 | Ht 60.5 in | Wt 175.0 lb

## 2023-07-16 DIAGNOSIS — Z1231 Encounter for screening mammogram for malignant neoplasm of breast: Secondary | ICD-10-CM

## 2023-07-16 DIAGNOSIS — Z853 Personal history of malignant neoplasm of breast: Secondary | ICD-10-CM

## 2023-07-16 DIAGNOSIS — Z01419 Encounter for gynecological examination (general) (routine) without abnormal findings: Secondary | ICD-10-CM | POA: Diagnosis not present

## 2023-07-16 NOTE — Patient Instructions (Addendum)
 I value your feedback and you entrusting [place] with your care. If you get a [facility] patient survey, I would appreciate you taking the time to let us know about your experience today. Thank you!  [facility] ([place])--[phone]

## 2023-08-06 ENCOUNTER — Other Ambulatory Visit: Payer: Self-pay | Admitting: Physician Assistant

## 2023-08-06 DIAGNOSIS — F419 Anxiety disorder, unspecified: Secondary | ICD-10-CM

## 2023-08-20 IMAGING — CR DG HAND COMPLETE 3+V*L*
1 series · 3 of 3 positions shown · non-contrast
Comparison: No prior.

CLINICAL DATA: Hand swelling.  No injury.

EXAM:
LEFT HAND - COMPLETE 3+ VIEW

[Series 1: dg hand complete left · 0.14mm/px · 3 of 3 slices shown]
[im 1/3]
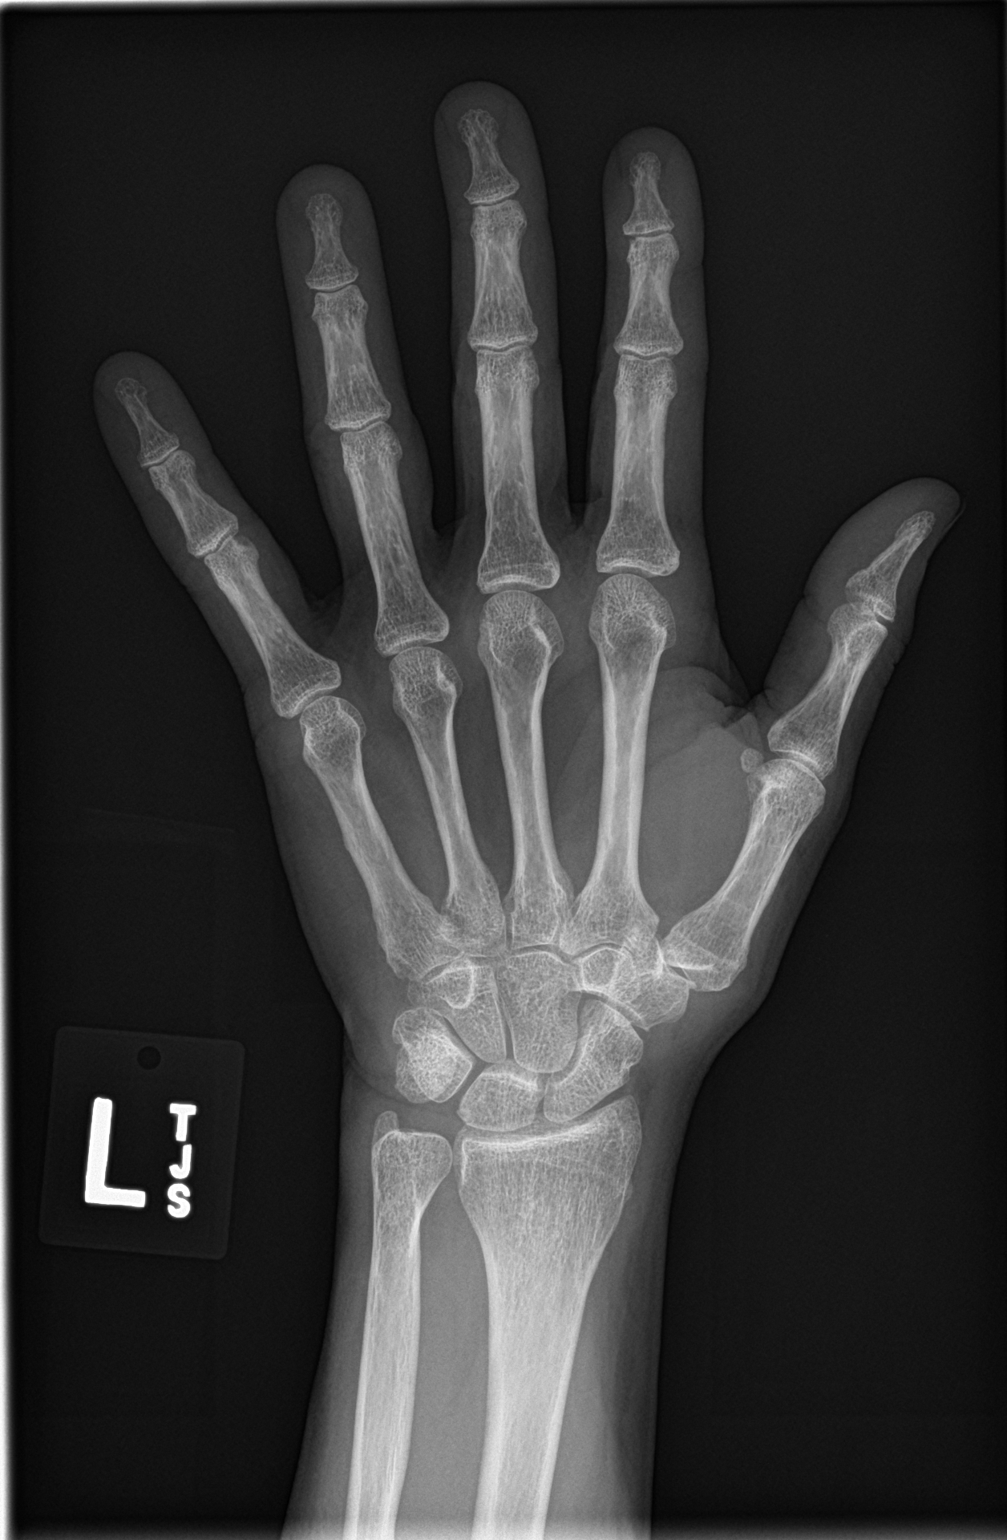
[im 2/3]
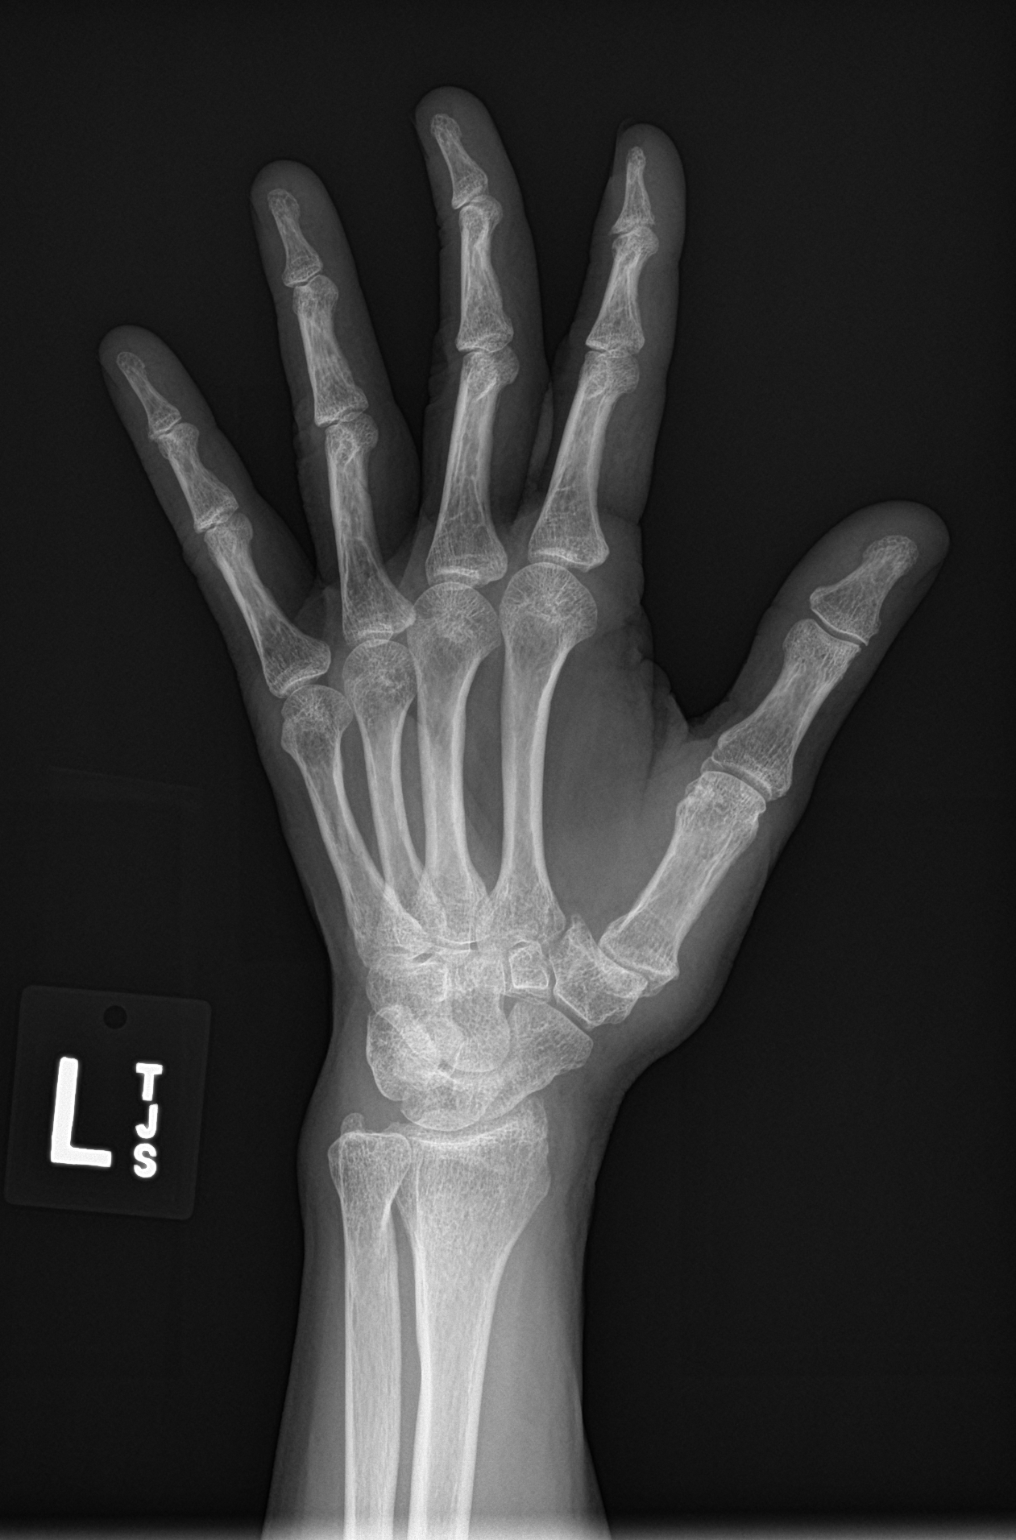
[im 3/3]
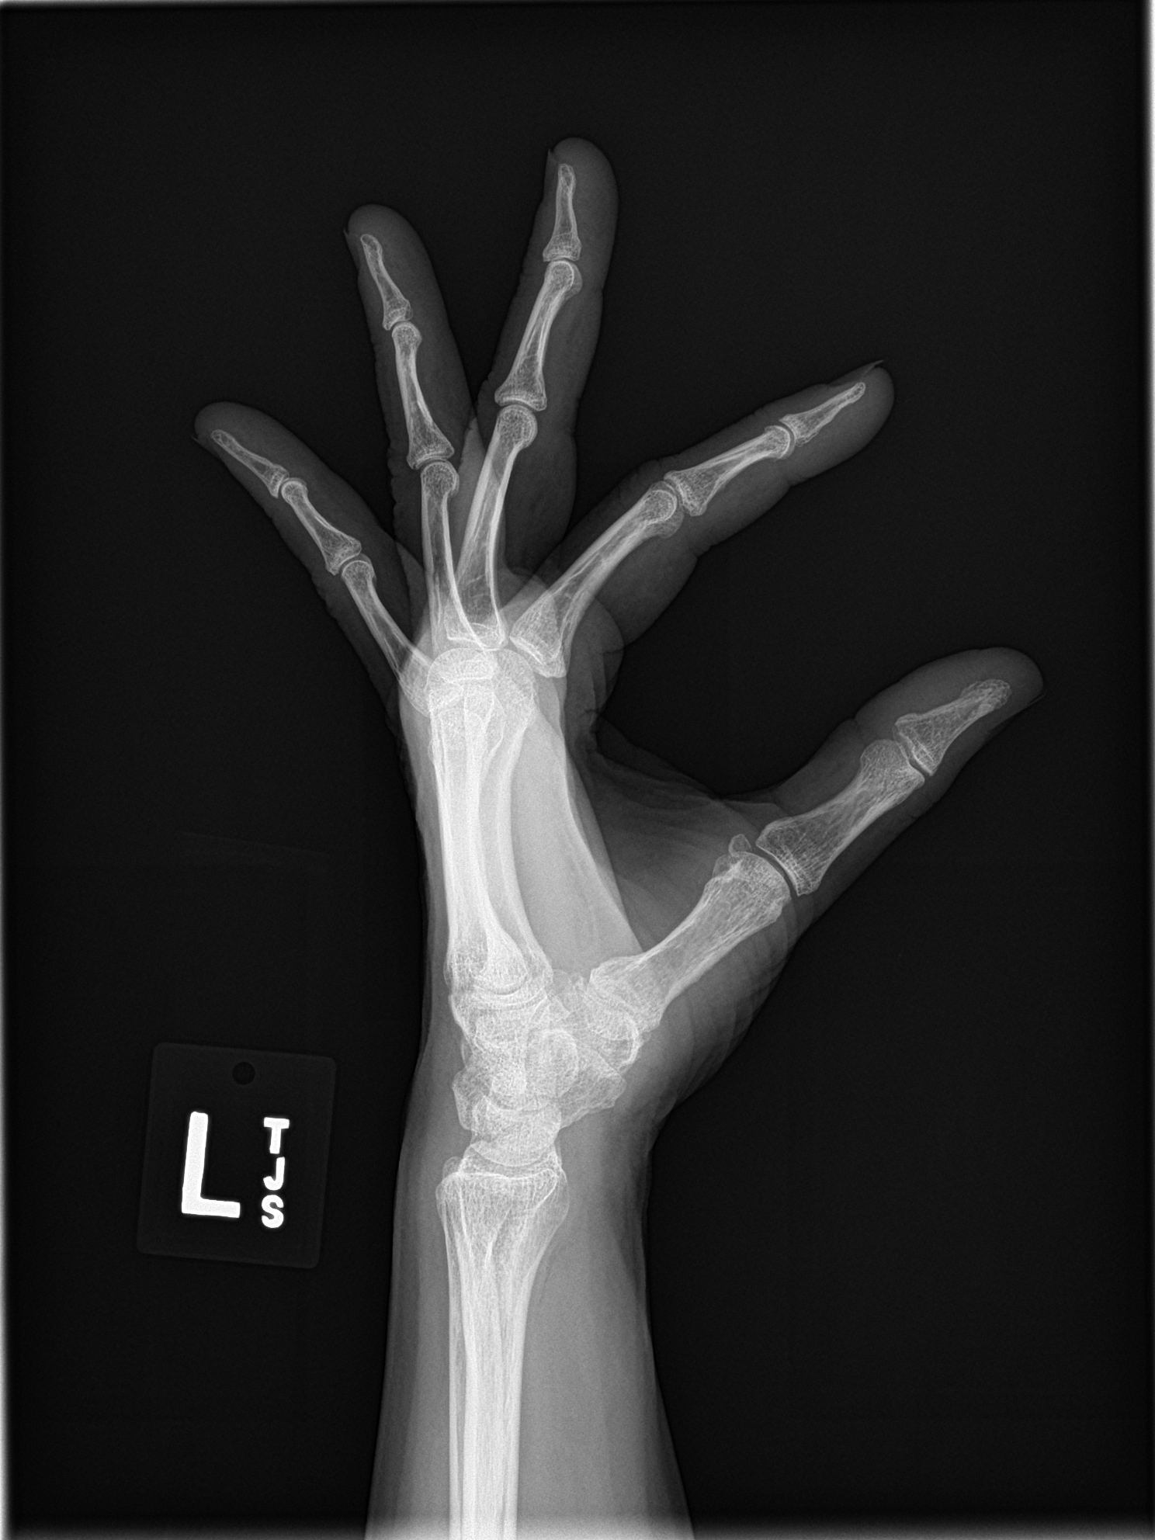

[3 of 3 positions shown; findings below may reference images not displayed]

FINDINGS: Mild degenerative change first carpometacarpal joint. No acute bony
or joint abnormality. No evidence of fracture or dislocation.
IMPRESSION: Mild degenerative change first carpometacarpal joint. No acute
abnormality identified.

## 2023-08-20 IMAGING — CR DG THORACIC SPINE 3V
1 series · 3 of 3 positions shown · non-contrast
Comparison: No prior.

CLINICAL DATA: Back pain.

EXAM:
THORACIC SPINE - 3 VIEWS

[Series 1: dg thoracic spine w/swimmers · 0.14mm/px · 3 of 3 slices shown]
[im 1/3]
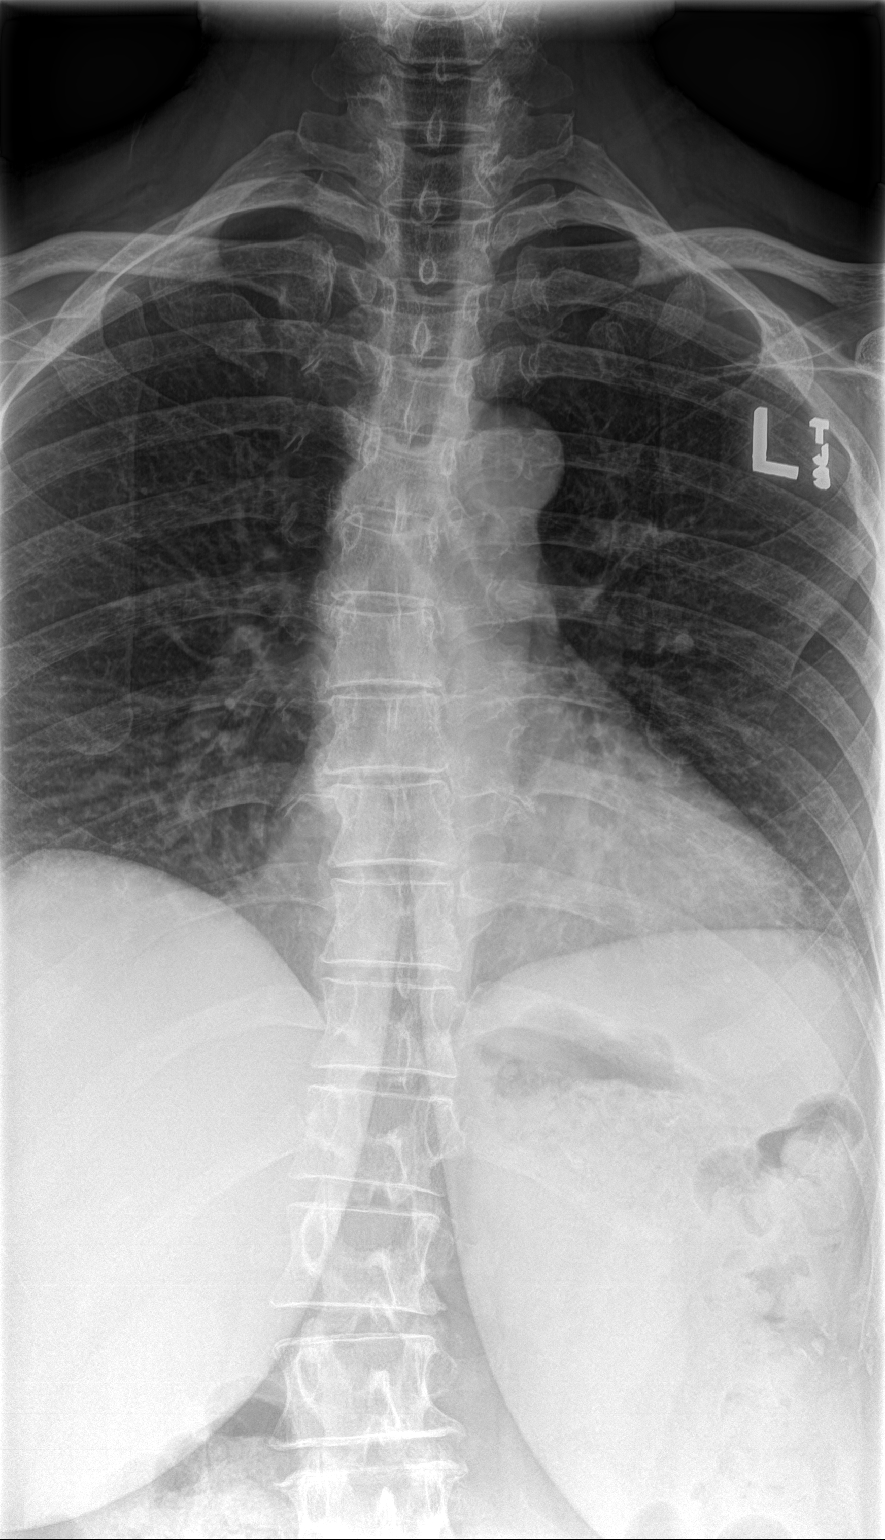
[im 2/3]
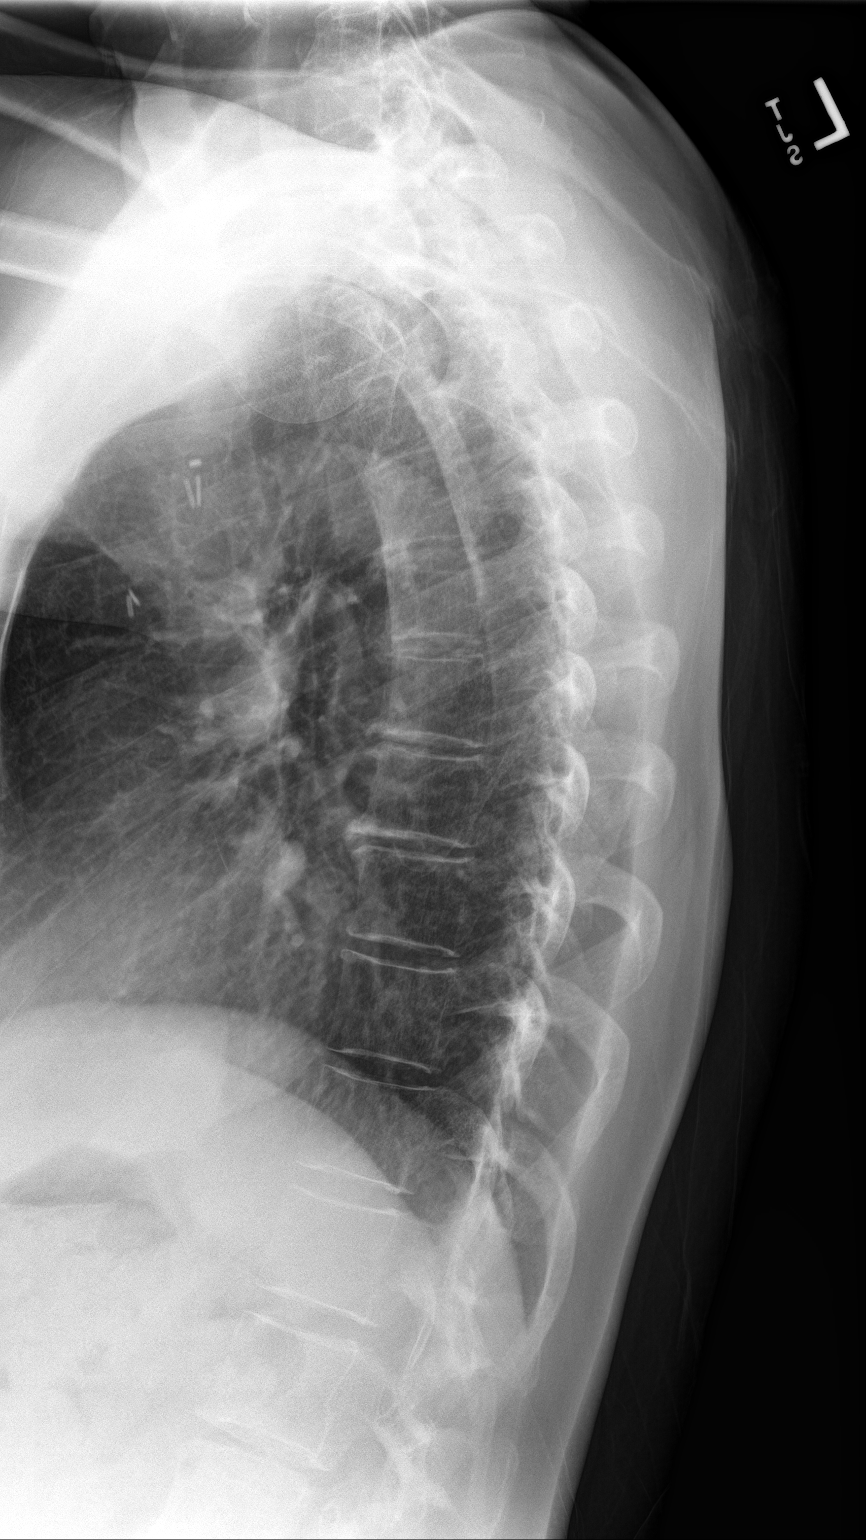
[im 3/3]
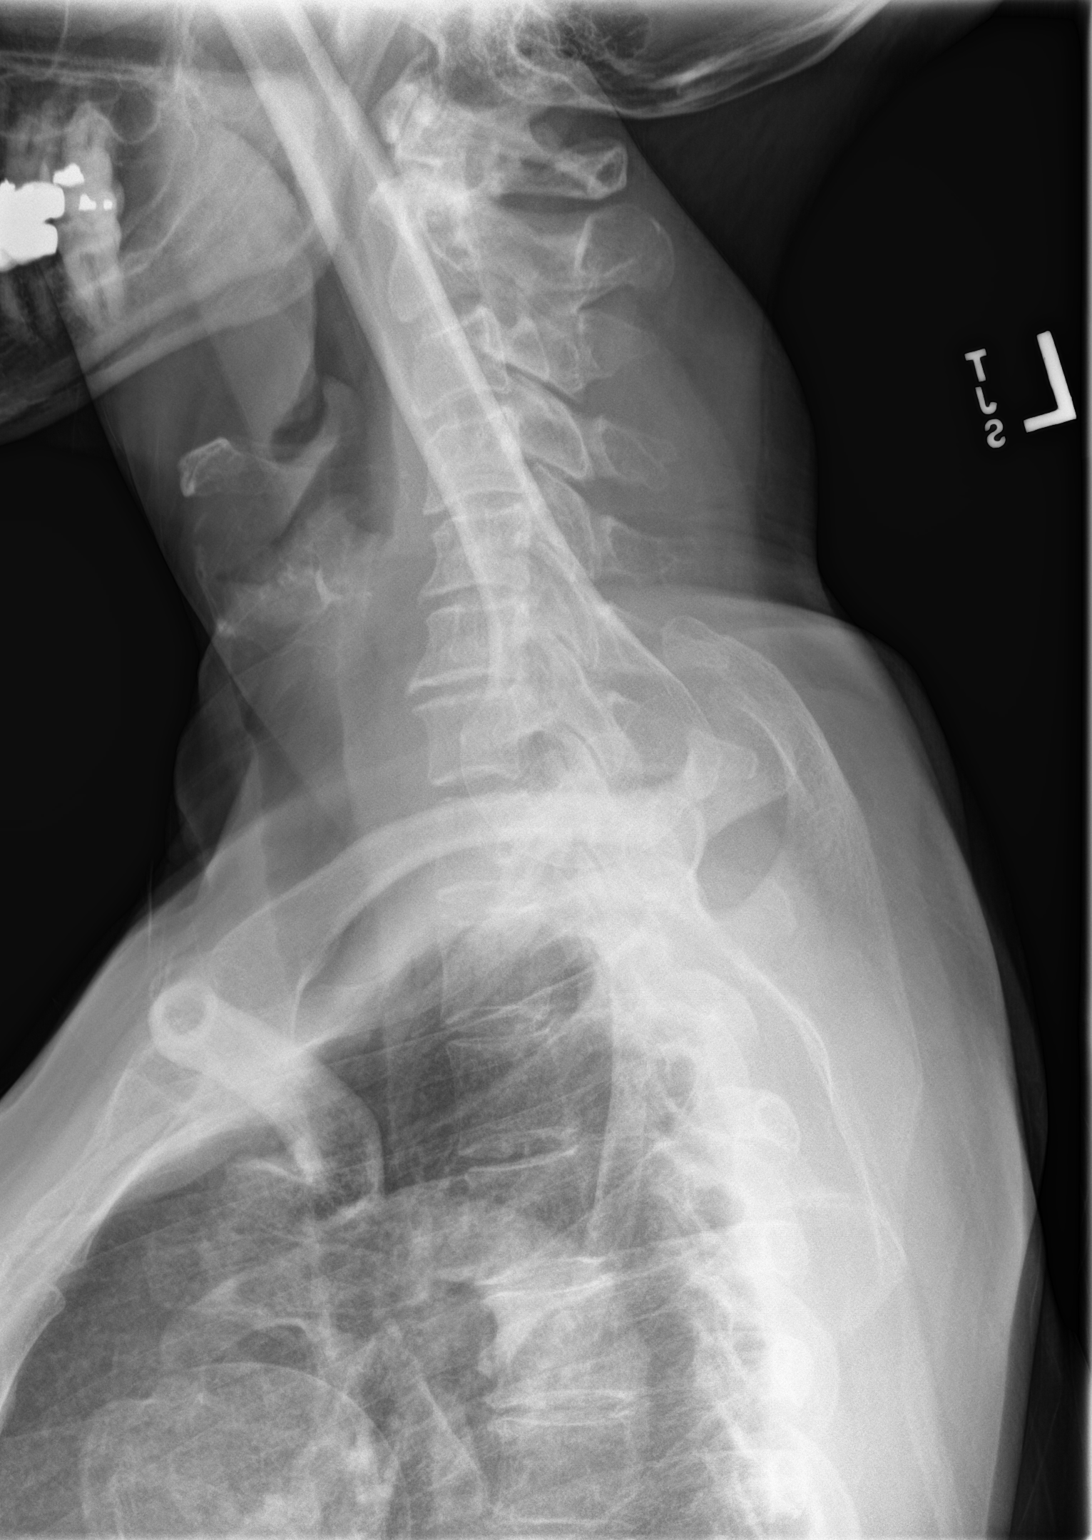

[3 of 3 positions shown; findings below may reference images not displayed]

FINDINGS: Thoracic spine scoliosis concave left. Diffuse mild multilevel
thoracic degenerative change. Prominent degenerative changes lower
cervical spine. No acute abnormality identified. No evidence of
fracture. Surgical clips noted over the upper chest.
IMPRESSION: Mild scoliosis concave left with diffuse mild thoracic degenerative
change. Prominent degenerative changes lower cervical spine. No
acute abnormality.

## 2023-08-20 IMAGING — CR DG FOOT COMPLETE 3+V*R*
1 series · 3 of 3 positions shown · non-contrast
Comparison: No recent.

CLINICAL DATA: Right foot pain.  No injury.

EXAM:
RIGHT FOOT COMPLETE - 3+ VIEW

[Series 1: dg foot complete right · 0.14mm/px · 3 of 3 slices shown]
[im 1/3]
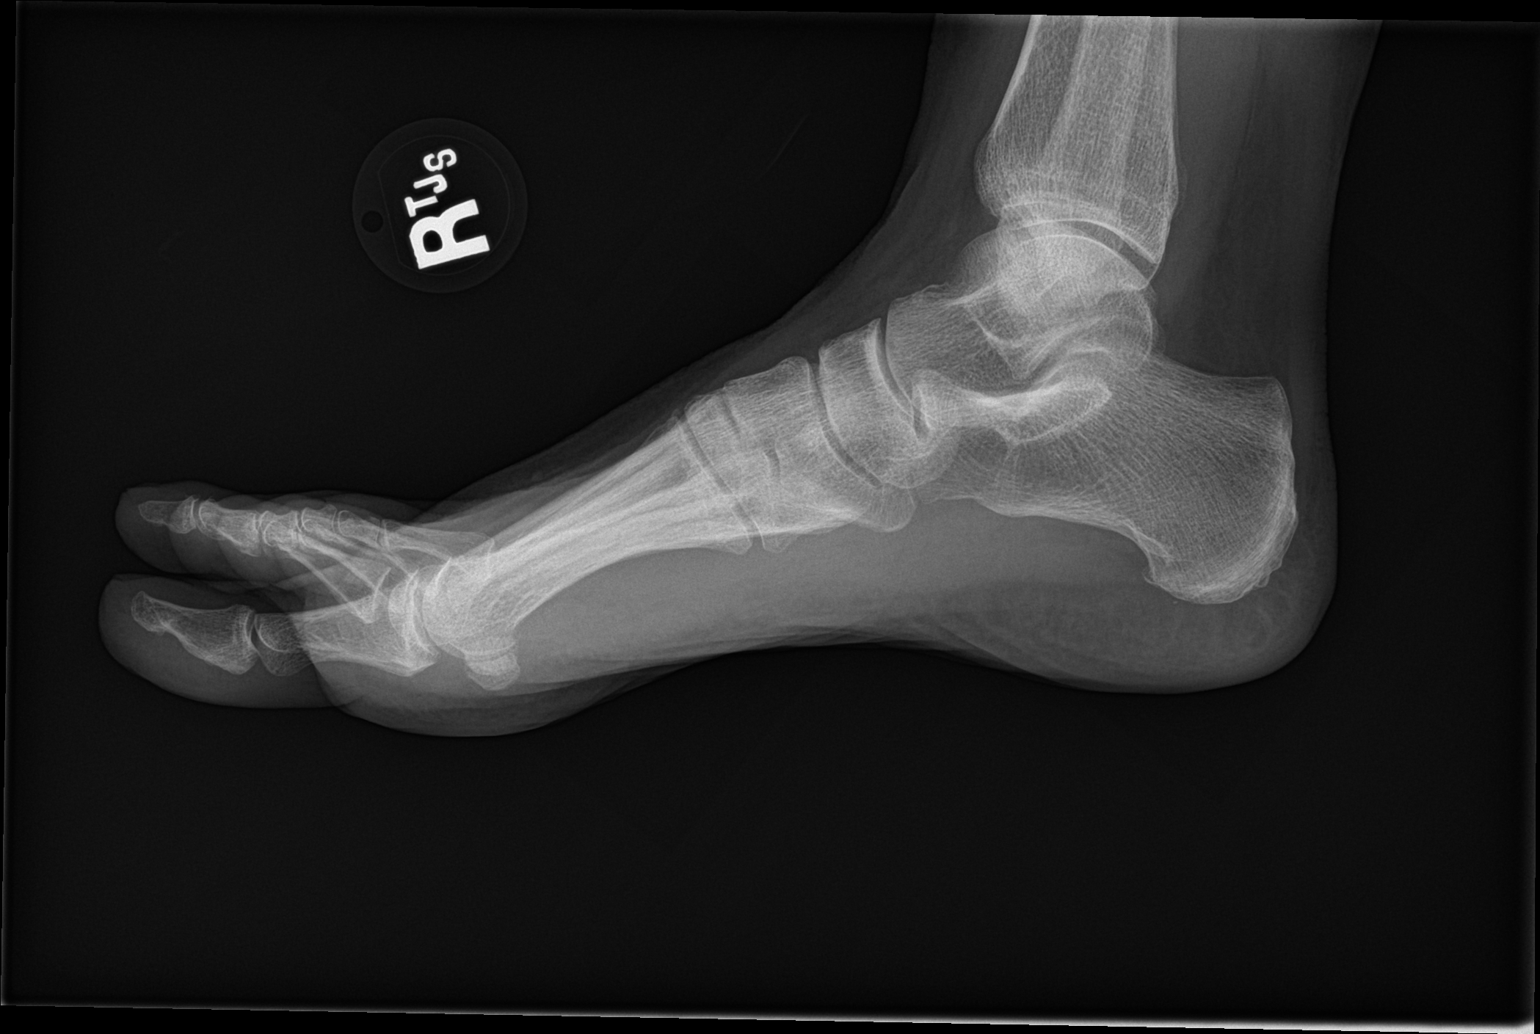
[im 2/3]
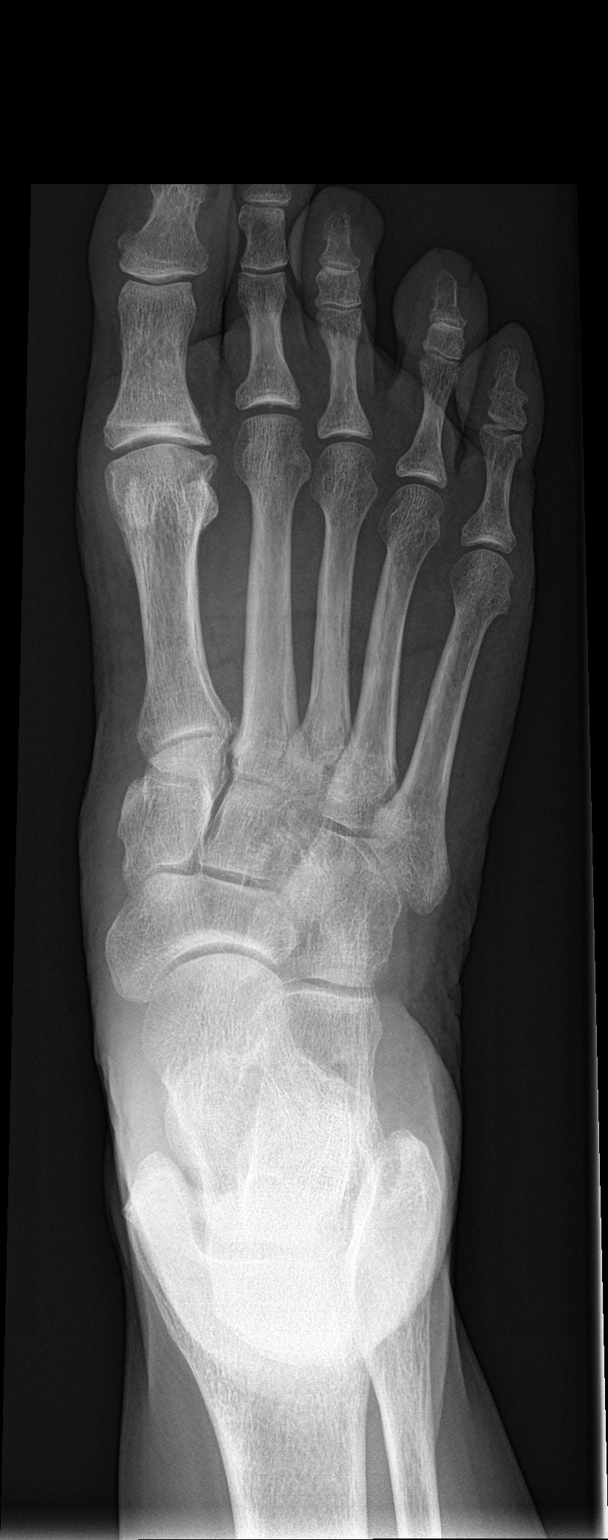
[im 3/3]
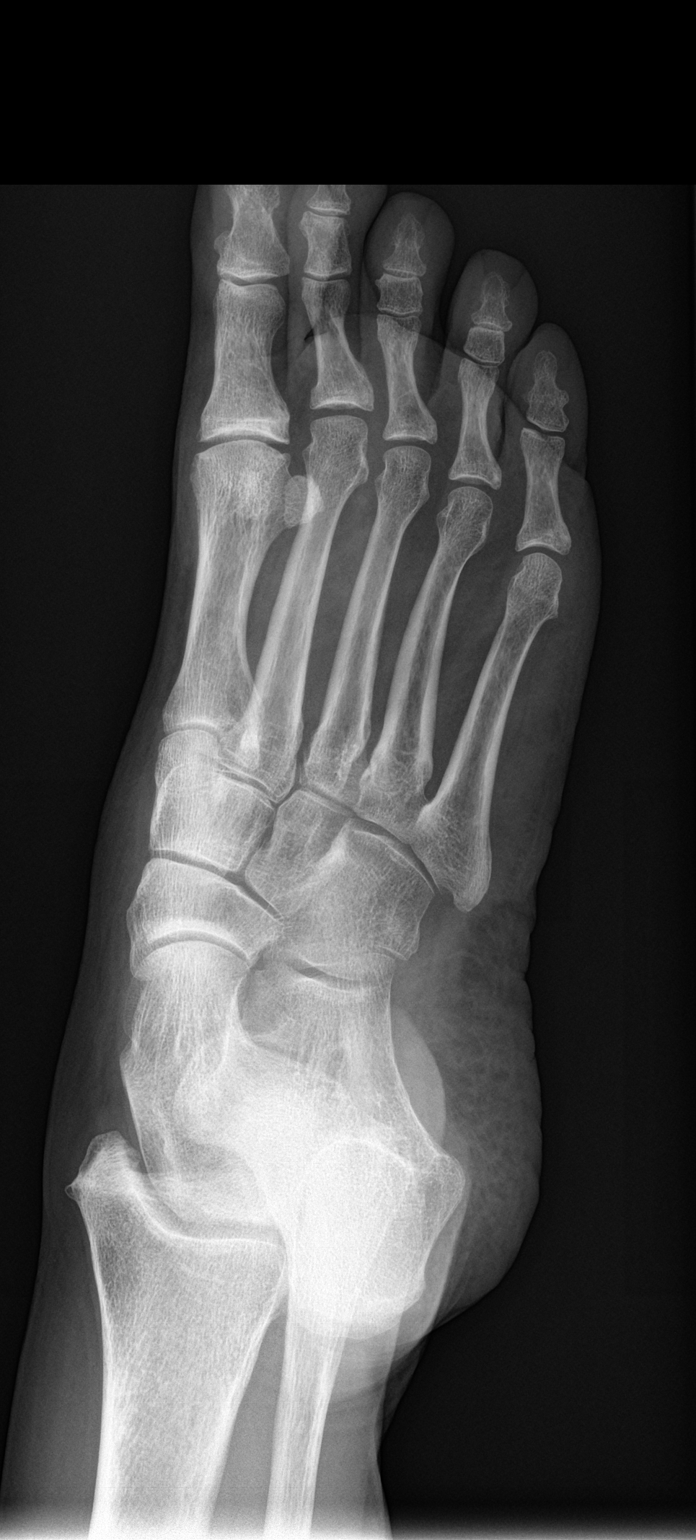

[3 of 3 positions shown; findings below may reference images not displayed]

FINDINGS: Degenerative change first MTP joint. No acute bony or joint
abnormality. No evidence of fracture dislocation. No radiopaque
foreign body.
IMPRESSION: Degenerative change first MTP joint.  No acute bony abnormality.

## 2023-08-24 ENCOUNTER — Ambulatory Visit
Admission: RE | Admit: 2023-08-24 | Discharge: 2023-08-24 | Disposition: A | Discharge status: Home / Self Care | Source: Ambulatory Visit | Attending: Obstetrics and Gynecology | Admitting: Obstetrics and Gynecology

## 2023-08-24 DIAGNOSIS — Z853 Personal history of malignant neoplasm of breast: Secondary | ICD-10-CM | POA: Insufficient documentation

## 2023-08-24 DIAGNOSIS — Z1231 Encounter for screening mammogram for malignant neoplasm of breast: Secondary | ICD-10-CM | POA: Diagnosis present

## 2023-08-29 ENCOUNTER — Ambulatory Visit: Payer: Self-pay | Admitting: Obstetrics and Gynecology

## 2023-09-07 ENCOUNTER — Other Ambulatory Visit: Payer: Self-pay | Admitting: Physician Assistant

## 2023-09-07 DIAGNOSIS — E785 Hyperlipidemia, unspecified: Secondary | ICD-10-CM

## 2023-09-10 ENCOUNTER — Encounter: Payer: BC Managed Care – PPO | Admitting: Dermatology

## 2023-10-09 ENCOUNTER — Encounter: Payer: Self-pay | Admitting: Dermatology

## 2023-10-09 ENCOUNTER — Ambulatory Visit: Payer: BC Managed Care – PPO | Admitting: Dermatology

## 2023-10-09 DIAGNOSIS — Z1283 Encounter for screening for malignant neoplasm of skin: Secondary | ICD-10-CM

## 2023-10-09 DIAGNOSIS — B079 Viral wart, unspecified: Secondary | ICD-10-CM | POA: Diagnosis not present

## 2023-10-09 DIAGNOSIS — D225 Melanocytic nevi of trunk: Secondary | ICD-10-CM

## 2023-10-09 DIAGNOSIS — W908XXA Exposure to other nonionizing radiation, initial encounter: Secondary | ICD-10-CM | POA: Diagnosis not present

## 2023-10-09 DIAGNOSIS — Z86018 Personal history of other benign neoplasm: Secondary | ICD-10-CM

## 2023-10-09 DIAGNOSIS — L814 Other melanin hyperpigmentation: Secondary | ICD-10-CM | POA: Diagnosis not present

## 2023-10-09 DIAGNOSIS — L578 Other skin changes due to chronic exposure to nonionizing radiation: Secondary | ICD-10-CM | POA: Diagnosis not present

## 2023-10-09 DIAGNOSIS — L821 Other seborrheic keratosis: Secondary | ICD-10-CM

## 2023-10-09 DIAGNOSIS — L820 Inflamed seborrheic keratosis: Secondary | ICD-10-CM

## 2023-10-09 DIAGNOSIS — D239 Other benign neoplasm of skin, unspecified: Secondary | ICD-10-CM

## 2023-10-09 DIAGNOSIS — D229 Melanocytic nevi, unspecified: Secondary | ICD-10-CM

## 2023-10-09 DIAGNOSIS — L918 Other hypertrophic disorders of the skin: Secondary | ICD-10-CM

## 2023-10-09 DIAGNOSIS — D2371 Other benign neoplasm of skin of right lower limb, including hip: Secondary | ICD-10-CM

## 2023-10-09 DIAGNOSIS — D2239 Melanocytic nevi of other parts of face: Secondary | ICD-10-CM

## 2023-10-09 DIAGNOSIS — D1801 Hemangioma of skin and subcutaneous tissue: Secondary | ICD-10-CM

## 2023-10-09 NOTE — Progress Notes (Signed)
 Follow-Up Visit   Subjective  Patricia Kemp is a 63 y.o. female who presents for the following: Skin Cancer Screening and Full Body Skin Exam. Hx of dysplastic nevi.   Lesions on face and forehead. Raised, irritated.   The patient presents for Total-Body Skin Exam (TBSE) for skin cancer screening and mole check. The patient has spots, moles and lesions to be evaluated, some may be new or changing and the patient may have concern these could be cancer.    The following portions of the chart were reviewed this encounter and updated as appropriate: medications, allergies, medical history  Review of Systems:  No other skin or systemic complaints except as noted in HPI or Assessment and Plan.  Objective  Well appearing patient in no apparent distress; mood and affect are within normal limits.  A full examination was performed including scalp, head, eyes, ears, nose, lips, neck, chest, axillae, abdomen, back, buttocks, bilateral upper extremities, bilateral lower extremities, hands, feet, fingers, toes, fingernails, and toenails. All findings within normal limits unless otherwise noted below.   Relevant physical exam findings are noted in the Assessment and Plan.  Left Upper Forehead x1 Verrucous papule -- Discussed viral etiology and contagion.  Left Anterior Mandible and left neck x5 Erythematous keratotic or waxy stuck-on papule  Assessment & Plan   SKIN CANCER SCREENING PERFORMED TODAY.  HISTORY OF DYSPLASTIC NEVI No evidence of recurrence today Recommend regular full body skin exams Recommend daily broad spectrum sunscreen SPF 30+ to sun-exposed areas, reapply every 2 hours as needed.  Call if any new or changing lesions are noted between office visits - R mid sternum, L pretibia, L calf   ACTINIC DAMAGE - Chronic condition, secondary to cumulative UV/sun exposure - diffuse scaly erythematous macules with underlying dyspigmentation - Recommend daily broad spectrum sunscreen  SPF 30+ to sun-exposed areas, reapply every 2 hours as needed.  - Staying in the shade or wearing long sleeves, sun glasses (UVA+UVB protection) and wide brim hats (4-inch brim around the entire circumference of the hat) are also recommended for sun protection.  - Call for new or changing lesions.  LENTIGINES, SEBORRHEIC KERATOSES, HEMANGIOMAS - Benign normal skin lesions - Benign-appearing - Call for any changes  MELANOCYTIC NEVI - Tan-brown and/or pink-flesh-colored symmetric macules and papules - Central upper abdomen 5.0 x 4.0 med dark brown speckled thin pap - L axilla 4.0 x 3.84mm med dark brown thin papule - pink flesh papules at left cheek - Benign appearing on exam today - Observation - Call clinic for new or changing moles - Recommend daily use of broad spectrum spf 30+ sunscreen to sun-exposed areas.   SEBORRHEIC KERATOSIS - Stuck-on, waxy, tan-brown papule at left nasal tip - Benign-appearing - Discussed benign etiology and prognosis. - Observe - Call for any changes  Acrochordons (Skin Tags) - Fleshy, skin-colored pedunculated papules at R waistline.  - Benign appearing.  - Observe. - If desired, they can be removed with an in office procedure that is not covered by insurance. - Please call the clinic if you notice any new or changing lesions.   DERMATOFIBROMA Exam: Firm pink/brown papulenodule with dimple sign R upper knee, R pretibia   Treatment Plan: A dermatofibroma is a benign growth possibly related to trauma, such as an insect bite, cut from shaving, or inflamed acne-type bump.  Treatment options to remove include shave or excision with resulting scar and risk of recurrence.  Since benign-appearing and not bothersome, will observe for now.  LENTIGINES Exam: tan macules at B/L nasal root adjacent to where glasses rest. Due to sun exposure Treatment Plan: Benign-appearing, observe. Recommend daily broad spectrum sunscreen SPF 30+ to sun-exposed areas,  reapply every 2 hours as needed.  Call for any changes   FILIFORM WART Left Upper Forehead x1 Destruction of lesion - Left Upper Forehead x1  Destruction method: cryotherapy   Informed consent: discussed and consent obtained   Lesion destroyed using liquid nitrogen: Yes   Region frozen until ice ball extended beyond lesion: Yes   Outcome: patient tolerated procedure well with no complications   Post-procedure details: wound care instructions given   Additional details:  Prior to procedure, discussed risks of blister formation, small wound, skin dyspigmentation, or rare scar following cryotherapy. Recommend Vaseline ointment to treated areas while healing.   INFLAMED SEBORRHEIC KERATOSIS Left Anterior Mandible and left neck x5 Symptomatic, irritating, patient would like treated.  Has paid presentation in next 1-2 weeks. Will schedule appt for treatment after this event.  Return in about 1 year (around 10/08/2024) for TBSE, HxDN; ISK treatment next available. SABRA LILLETTE Kate Robin, CMA, am acting as scribe for Rexene Rattler, MD.   Documentation: I have reviewed the above documentation for accuracy and completeness, and I agree with the above.  Rexene Rattler, MD

## 2023-10-09 NOTE — Patient Instructions (Addendum)
 Cryotherapy Aftercare  Wash gently with soap and water everyday.   Apply Vaseline and Band-Aid daily until healed.    Recommend daily broad spectrum sunscreen SPF 30+ to sun-exposed areas, reapply every 2 hours as needed. Call for new or changing lesions.  Staying in the shade or wearing long sleeves, sun glasses (UVA+UVB protection) and wide brim hats (4-inch brim around the entire circumference of the hat) are also recommended for sun protection.     Melanoma ABCDEs  Melanoma is the most dangerous type of skin cancer, and is the leading cause of death from skin disease.  You are more likely to develop melanoma if you: Have light-colored skin, light-colored eyes, or red or blond hair Spend a lot of time in the sun Tan regularly, either outdoors or in a tanning bed Have had blistering sunburns, especially during childhood Have a close family member who has had a melanoma Have atypical moles or large birthmarks  Early detection of melanoma is key since treatment is typically straightforward and cure rates are extremely high if we catch it early.   The first sign of melanoma is often a change in a mole or a new dark spot.  The ABCDE system is a way of remembering the signs of melanoma.  A for asymmetry:  The two halves do not match. B for border:  The edges of the growth are irregular. C for color:  A mixture of colors are present instead of an even brown color. D for diameter:  Melanomas are usually (but not always) greater than 6mm - the size of a pencil eraser. E for evolution:  The spot keeps changing in size, shape, and color.  Please check your skin once per month between visits. You can use a small mirror in front and a large mirror behind you to keep an eye on the back side or your body.   If you see any new or changing lesions before your next follow-up, please call to schedule a visit.  Please continue daily skin protection including broad spectrum sunscreen SPF 30+ to  sun-exposed areas, reapplying every 2 hours as needed when you're outdoors.   Staying in the shade or wearing long sleeves, sun glasses (UVA+UVB protection) and wide brim hats (4-inch brim around the entire circumference of the hat) are also recommended for sun protection.     Due to recent changes in healthcare laws, you may see results of your pathology and/or laboratory studies on MyChart before the doctors have had a chance to review them. We understand that in some cases there may be results that are confusing or concerning to you. Please understand that not all results are received at the same time and often the doctors may need to interpret multiple results in order to provide you with the best plan of care or course of treatment. Therefore, we ask that you please give us  2 business days to thoroughly review all your results before contacting the office for clarification. Should we see a critical lab result, you will be contacted sooner.   If You Need Anything After Your Visit  If you have any questions or concerns for your doctor, please call our main line at [phone] and press option 4 to reach your doctor's medical assistant. If no one answers, please leave a voicemail as directed and we will return your call as soon as possible. Messages left after 4 pm will be answered the following business day.   You may also send us  a  message via MyChart. We typically respond to MyChart messages within 1-2 business days.  For prescription refills, please ask your pharmacy to contact our office. Our fax number is [phone].  If you have an urgent issue when the clinic is closed that cannot wait until the next business day, you can page your doctor at the number below.    Please note that while we do our best to be available for urgent issues outside of office hours, we are not available 24/7.   If you have an urgent issue and are unable to reach us , you may choose to seek medical care at your  doctor's office, retail clinic, urgent care center, or emergency room.  If you have a medical emergency, please immediately call 911 or go to the emergency department.  Pager Numbers  - Dr. Hester: [phone]  - Dr. Jackquline: [phone]  - Dr. Claudene: [phone]   - Dr. Raymund: [phone]  In the event of inclement weather, please call our main line at [phone] for an update on the status of any delays or closures.  Dermatology Medication Tips: Please keep the boxes that topical medications come in in order to help keep track of the instructions about where and how to use these. Pharmacies typically print the medication instructions only on the boxes and not directly on the medication tubes.   If your medication is too expensive, please contact our office at [phone] option 4 or send us  a message through MyChart.   We are unable to tell what your co-pay for medications will be in advance as this is different depending on your insurance coverage. However, we may be able to find a substitute medication at lower cost or fill out paperwork to get insurance to cover a needed medication.   If a prior authorization is required to get your medication covered by your insurance company, please allow us  1-2 business days to complete this process.  Drug prices often vary depending on where the prescription is filled and some pharmacies may offer cheaper prices.  The website www.goodrx.com contains coupons for medications through different pharmacies. The prices here do not account for what the cost may be with help from insurance (it may be cheaper with your insurance), but the website can give you the price if you did not use any insurance.  - You can print the associated coupon and take it with your prescription to the pharmacy.  - You may also stop by our office during regular business hours and pick up a GoodRx coupon card.  - If you need your prescription sent electronically to  a different pharmacy, notify our office through [facility] or by phone at [phone] option 4.     Si Usted Necesita Algo Despus de Su Visita  Tambin puede enviarnos un mensaje a travs de Clinical cytogeneticist. Por lo general respondemos a los mensajes de MyChart en el transcurso de 1 a 2 das hbiles.  Para renovar recetas, por favor pida a su farmacia que se ponga en contacto con nuestra oficina. Randi lakes de fax es [place] [phone].  Si tiene un asunto urgente cuando la clnica est cerrada y que no puede esperar hasta el siguiente da hbil, puede llamar/localizar a su doctor(a) al nmero que aparece a continuacin.   Por favor, tenga en cuenta que aunque hacemos todo lo posible para estar disponibles para asuntos urgentes fuera del horario de [place], no estamos disponibles las 24 horas del da, los 7 [address]  [address] de la [place].  Si tiene un problema urgente y no puede comunicarse con nosotros, puede optar por buscar atencin mdica  en el consultorio de su doctor(a), en una clnica privada, en un centro de atencin urgente o en una sala de emergencias.  Si tiene Engineer, drilling, por favor llame inmediatamente al 911 o vaya a la sala de emergencias.  Nmeros de bper  - Dr. Hester: [phone]  - Dra. Jackquline: [phone]  - Dr. Claudene: [phone]  - Dra. Kitts: [phone]  En caso de inclemencias del [place], por favor llame a nuestra lnea principal al [phone] para una actualizacin sobre el estado de cualquier retraso o cierre.  Consejos para la medicacin en dermatologa: Por favor, guarde las cajas en las que vienen los medicamentos de uso tpico para ayudarle a seguir las instrucciones sobre dnde y cmo usarlos. Las farmacias generalmente imprimen las instrucciones del medicamento slo en las cajas y no directamente en los tubos del [place].   Si su medicamento es muy caro, por favor, pngase en contacto con landry rieger llamando al [phone] y  presione la opcin 4 o envenos un mensaje a travs de Clinical cytogeneticist.   No podemos decirle cul ser su copago por los medicamentos por adelantado ya que esto es diferente dependiendo de la cobertura de su seguro. Sin embargo, es posible que podamos encontrar un medicamento sustituto a Audiological scientist un formulario para que el seguro cubra el medicamento que se considera necesario.   Si se requiere una autorizacin previa para que su compaa de seguros malta su medicamento, por favor permtanos de 1 a 2 das hbiles para completar este proceso.  Los precios de los medicamentos varan con frecuencia dependiendo del Environmental consultant de dnde se surte la receta y alguna farmacias pueden ofrecer precios ms baratos.  El sitio web www.goodrx.com tiene cupones para medicamentos de Health and safety inspector. Los precios aqu no tienen en cuenta lo que podra costar con la ayuda del seguro (puede ser ms barato con su seguro), pero el sitio web puede darle el precio si no utiliz Tourist information centre manager.  - Puede imprimir el cupn correspondiente y llevarlo con su receta a la farmacia.  - Tambin puede pasar por nuestra oficina durante el horario de atencin regular y Education officer, museum una tarjeta de cupones de GoodRx.  - Si necesita que su receta se enve electrnicamente a una farmacia diferente, informe a nuestra oficina a travs de MyChart de [place] o por telfono llamando al [phone] y presione la opcin 4.

## 2023-10-16 ENCOUNTER — Encounter: Payer: Self-pay | Admitting: Family Medicine

## 2023-10-17 ENCOUNTER — Other Ambulatory Visit: Payer: Self-pay

## 2023-10-17 ENCOUNTER — Other Ambulatory Visit: Payer: Self-pay | Admitting: Physician Assistant

## 2023-10-17 DIAGNOSIS — E785 Hyperlipidemia, unspecified: Secondary | ICD-10-CM

## 2023-10-17 MED ORDER — ATORVASTATIN CALCIUM 10 MG PO TABS
10.0000 mg | ORAL_TABLET | Freq: Every day | ORAL | 0 refills | Status: DC
Start: 2023-10-17 — End: 2023-11-21

## 2023-10-18 ENCOUNTER — Ambulatory Visit: Admitting: Podiatry

## 2023-10-18 DIAGNOSIS — B351 Tinea unguium: Secondary | ICD-10-CM | POA: Diagnosis not present

## 2023-10-18 DIAGNOSIS — Z79899 Other long term (current) drug therapy: Secondary | ICD-10-CM | POA: Diagnosis not present

## 2023-10-18 NOTE — Progress Notes (Signed)
 Subjective:  Patient ID: Patricia Kemp, female    DOB: 1960-05-06,  MRN: [id number]  Chief Complaint  Patient presents with   Nail Problem    Pt stated that she feels like her nails are trying to come off she stated that they are discolored no pain or discomfort present     63 y.o. female presents with the above complaint.  Patient presents with complaint right hallux thickened onychodystrophy mycotic nail x 1.  She states the nail is getting very thick and discolored.  There is no pain or discomfort present.  She wanted get it evaluated she wanted to discuss treatment options for nail fungus and fungus denies any other acute complaints.   Review of Systems: Negative except as noted in the HPI. Denies N/V/F/Ch.  Past Medical History:  Diagnosis Date   Allergy    Arthritis    Bleeds easily (HCC)    PT STATES IT TOOK HER A LITTLE LONGER TO STOP BLEEDING AFTER SINUS SURGERY   BRCA negative 11/2020   MyRisk neg except SDHA VUS   Cancer (HCC) 09/2010   Right breast cancer   Depression    Dysplastic nevus 08/25/2020   R mid sternum, mod to severe atypia   Dysplastic nevus 08/25/2020   L pretibia, mod to severe atypia   Dysplastic nevus 08/25/2020   L calf, mod to severe atypia   Dysrhythmia    H/O IRREGULAR HEART BEAT-ASYMPTOMATIC   Headache    MIGRAINES   Heart murmur    ASYMPTOMATIC   HLD (hyperlipidemia)    Osteoporosis    in spine   Sleep apnea    NO CPAP-UNABLE TO USE DUE TO SINUS ISSUES    Current Outpatient Medications:    alendronate  (FOSAMAX ) 70 MG tablet, Take 1 tablet (70 mg total) by mouth every 7 (seven) days. Take with a full glass of water on an empty stomach., Disp: 4 tablet, Rfl: 11   atorvastatin  (LIPITOR) 10 MG tablet, Take 1 tablet (10 mg total) by mouth daily., Disp: 30 tablet, Rfl: 0   Calcium  Citrate-Vitamin D (CALCIUM  CITRATE + D PO), Take 2 tablets by mouth daily. 630 mg/ 500 units, Disp: , Rfl:    Cholecalciferol (VITAMIN D3) 125 MCG (5000 UT) TABS,  Take 5,000 Units by mouth daily. , Disp: , Rfl:    DULoxetine  (CYMBALTA ) 30 MG capsule, TAKE 1 CAPSULE BY MOUTH EVERY DAY, Disp: 90 capsule, Rfl: 2   lidocaine -prilocaine  (EMLA ) cream, Apply 1 application topically as needed., Disp: 30 g, Rfl: 0   metFORMIN  (GLUCOPHAGE -XR) 750 MG 24 hr tablet, Take 1 tablet (750 mg total) by mouth daily with breakfast. Daily with largest meal, Disp: 90 tablet, Rfl: 3   Multiple Vitamins-Minerals (WOMENS 50+ MULTI VITAMIN/MIN PO), Take 1 tablet by mouth daily. , Disp: , Rfl:    Zinc 20 MG CAPS, Take 20 mg by mouth daily., Disp: , Rfl:   Social History   Tobacco Use  Smoking Status Never  Smokeless Tobacco Never    Allergies  Allergen Reactions   Amoxicillin Nausea Only   Objective:  There were no vitals filed for this visit. There is no height or weight on file to calculate BMI. Constitutional Well developed. Well nourished.  Vascular Dorsalis pedis pulses palpable bilaterally. Posterior tibial pulses palpable bilaterally. Capillary refill normal to all digits.  No cyanosis or clubbing noted. Pedal hair growth normal.  Neurologic Normal speech. Oriented to person, place, and time. Epicritic sensation to light touch grossly present bilaterally.  Dermatologic Nails right hallux thickening likely dystrophic mycotic nail x 1 Skin within normal limits  Orthopedic: Normal joint ROM without pain or crepitus bilaterally. No visible deformities. No bony tenderness.   Radiographs: None Assessment:   1. Long-term use of high-risk medication   2. Nail fungus   3. Onychomycosis due to dermatophyte    Plan:  Patient was evaluated and treated and all questions answered.  Right hallux onychomycosis with uncertain prognosis -Educated the patient on the etiology of onychomycosis and various treatment options associated with improving the fungal load.  I explained to the patient that there is 3 treatment options available to treat the onychomycosis  including topical, p.o., laser treatment.  Patient elected to undergo p.o. options with Lamisil/terbinafine therapy.  In order for me to start the medication therapy, I explained to the patient the importance of evaluating the liver and obtaining the liver function test.  Once the liver function test comes back normal I will start him on 44-month course of Lamisil therapy.  Patient understood all risk and would like to proceed with Lamisil therapy.  I have asked the patient to immediately stop the Lamisil therapy if she has any reactions to it and call the office or go to the emergency room right away.  Patient states understanding   No follow-ups on file.

## 2023-10-19 LAB — HEPATIC FUNCTION PANEL
ALT: 42 IU/L — ABNORMAL HIGH (ref 0–32)
AST: 35 IU/L (ref 0–40)
Albumin: 4.7 g/dL (ref 3.9–4.9)
Alkaline Phosphatase: 127 IU/L — ABNORMAL HIGH (ref 44–121)
Bilirubin Total: 0.6 mg/dL (ref 0.0–1.2)
Bilirubin, Direct: 0.2 mg/dL (ref 0.00–0.40)
Total Protein: 6.7 g/dL (ref 6.0–8.5)

## 2023-10-31 ENCOUNTER — Ambulatory Visit: Admitting: Dermatology

## 2023-10-31 DIAGNOSIS — L821 Other seborrheic keratosis: Secondary | ICD-10-CM

## 2023-10-31 DIAGNOSIS — D225 Melanocytic nevi of trunk: Secondary | ICD-10-CM

## 2023-10-31 DIAGNOSIS — D229 Melanocytic nevi, unspecified: Secondary | ICD-10-CM

## 2023-10-31 DIAGNOSIS — L820 Inflamed seborrheic keratosis: Secondary | ICD-10-CM

## 2023-10-31 NOTE — Patient Instructions (Addendum)

## 2023-10-31 NOTE — Progress Notes (Signed)
   Follow-Up Visit   Subjective  Patricia Kemp is a 63 y.o. female who presents for the following: Inflamed Sks to treat today on the left neck and left anterior mandible. She also has other spots to check on the neck, intermammary, and back at bra line. Necklaces get caught on growths on neck.   The following portions of the chart were reviewed this encounter and updated as appropriate: medications, allergies, medical history  Review of Systems:  No other skin or systemic complaints except as noted in HPI or Assessment and Plan.  Objective  Well appearing patient in no apparent distress; mood and affect are within normal limits.  A focused examination was performed of the following areas: Face, neck, chest  Relevant physical exam findings are noted in the Assessment and Plan.  L neck x 2, L mandible x 1, L jaw x 1, L cheek x 1, R neck x 4 Erythematous stuck-on, waxy papule or plaque  Assessment & Plan   INFLAMED SEBORRHEIC KERATOSIS L neck x 2, L mandible x 1, L jaw x 1, L cheek x 1, R neck x 4 Symptomatic, irritating, patient would like treated. May need more than one treatment to clear.  Destruction of lesion - L neck x 2, L mandible x 1, L jaw x 1, L cheek x 1, R neck x 4  Destruction method: cryotherapy   Informed consent: discussed and consent obtained   Lesion destroyed using liquid nitrogen: Yes   Region frozen until ice ball extended beyond lesion: Yes   Outcome: patient tolerated procedure well with no complications   Post-procedure details: wound care instructions given   Additional details:  Prior to procedure, discussed risks of blister formation, small wound, skin dyspigmentation, or rare scar following cryotherapy. Recommend Vaseline ointment to treated areas while healing.    SEBORRHEIC KERATOSIS - Stuck-on, waxy, tan-brown papules and/or plaques at mid back at braline - Benign-appearing - Discussed benign etiology and prognosis. - Observe - Call for any  changes  MELANOCYTIC NEVI Exam: Tan-brown and/or pink-flesh-colored symmetric macules and papules, including R mid back  Treatment Plan: Benign appearing on exam today. Recommend observation. Call clinic for new or changing moles. Recommend daily use of broad spectrum spf 30+ sunscreen to sun-exposed areas.   Return in 2 months (on 12/31/2023) for recheck ISKs.  IAndrea Kerns, CMA, am acting as scribe for Rexene Rattler, MD .   Documentation: I have reviewed the above documentation for accuracy and completeness, and I agree with the above.  Rexene Rattler, MD

## 2023-11-21 ENCOUNTER — Ambulatory Visit (INDEPENDENT_AMBULATORY_CARE_PROVIDER_SITE_OTHER): Admitting: Family Medicine

## 2023-11-21 ENCOUNTER — Encounter: Payer: Self-pay | Admitting: Family Medicine

## 2023-11-21 VITALS — BP 123/82 | HR 85 | Temp 98.9°F | Ht 61.0 in | Wt 175.5 lb

## 2023-11-21 DIAGNOSIS — Z0000 Encounter for general adult medical examination without abnormal findings: Secondary | ICD-10-CM

## 2023-11-21 DIAGNOSIS — Z713 Dietary counseling and surveillance: Secondary | ICD-10-CM

## 2023-11-21 DIAGNOSIS — G8929 Other chronic pain: Secondary | ICD-10-CM

## 2023-11-21 DIAGNOSIS — F419 Anxiety disorder, unspecified: Secondary | ICD-10-CM

## 2023-11-21 DIAGNOSIS — Z0001 Encounter for general adult medical examination with abnormal findings: Secondary | ICD-10-CM

## 2023-11-21 DIAGNOSIS — M810 Age-related osteoporosis without current pathological fracture: Secondary | ICD-10-CM

## 2023-11-21 DIAGNOSIS — F40298 Other specified phobia: Secondary | ICD-10-CM

## 2023-11-21 DIAGNOSIS — R7303 Prediabetes: Secondary | ICD-10-CM

## 2023-11-21 DIAGNOSIS — E7849 Other hyperlipidemia: Secondary | ICD-10-CM

## 2023-11-21 DIAGNOSIS — M25561 Pain in right knee: Secondary | ICD-10-CM

## 2023-11-21 DIAGNOSIS — N182 Chronic kidney disease, stage 2 (mild): Secondary | ICD-10-CM

## 2023-11-21 DIAGNOSIS — E66811 Obesity, class 1: Secondary | ICD-10-CM | POA: Diagnosis not present

## 2023-11-21 DIAGNOSIS — R7401 Elevation of levels of liver transaminase levels: Secondary | ICD-10-CM

## 2023-11-21 DIAGNOSIS — F32A Depression, unspecified: Secondary | ICD-10-CM

## 2023-11-21 DIAGNOSIS — B351 Tinea unguium: Secondary | ICD-10-CM | POA: Diagnosis not present

## 2023-11-21 MED ORDER — ATORVASTATIN CALCIUM 10 MG PO TABS: 10.0000 mg | ORAL_TABLET | Freq: Every day | ORAL | 3 refills | Status: DC

## 2023-11-21 MED ORDER — CICLOPIROX 8 % EX SOLN
Freq: Every day | CUTANEOUS | 1 refills | Status: AC
Start: 2023-11-21 — End: ?

## 2023-11-21 MED ORDER — DULOXETINE HCL 30 MG PO CPEP
30.0000 mg | ORAL_CAPSULE | Freq: Every day | ORAL | 2 refills | Status: AC
Start: 2023-11-21 — End: ?

## 2023-11-21 NOTE — Patient Instructions (Addendum)
 Recommended vaccines: Flu, covid and pneumonia (prevnar-20 or -21).   I strongly encourage you to incorporate exercise into your daily routine (at least 30 minutes most days of the week) and monitor your caloric intake, restricting it to around 1700 calories per day.  If you are interested in meeting with a nutritionist, please let me know and I will send a referral. - I generally encourage patients to measure/weigh their foods/beverages for a few days to get a more accurate idea of what different amounts of things look like on their plate or in their glass.  - Using smaller plates/glasses also helps in that it tricks our minds into thinking we've eaten more than we have. Studies have shown that we eat more when presented with larger plates, even with the same amount of food on them.   - Plan to eat until you are no longer hungry, rather than until you're full.  I encourage you to do low impact exercises as available, such as swimming or maybe an elliptical if tolerable, and/or incorporate chair exercises into your daily routine.  Even if these actions don't ultimately lead to weight loss, increasing your activity will still help to reduce your insulin resistance and will improve your cardiovascular health (making heart attack/stroke less likely as you age).

## 2023-11-21 NOTE — Progress Notes (Addendum)
 Subjective:    Cyana Shook is a 63 y.o. female who presents for a Welcome to Medicare exam.     HPI: Patient has several issues she would like to discuss today in addition to her Medicare annual wellness exam.  Patient did have a wellness exam of some form performed by her insurance.  However it is unclear exactly what this entailed.  Did not encompass a Welcome to Medicare visit, as patient reports a physical exam was not performed at that time.  She notes that she recently saw podiatry for a fungal infection on her great toenails bilaterally.  However, the podiatrist was unwilling to prescribe oral medication to address this due to her ALT being mildly elevated at 42.  She notes this has been a slow trend of increase over the past 4 years.  She also endorses weight loss concerns due to being unable to exercise effectively due to knee pain related to bursitis.  She notes that she does not snack and she does not feel that she eats much.  Her sisters are on weight loss injection medications and she is interested in pursuing these as well.  She does have a history of osteoporosis and has been on alendronate  since 2020.  She is interested in potentially stopping alendronate  at the end of this year.       Objective:    Today's Vitals   11/21/23 0840  BP: 123/82  Pulse: 85  Temp: 98.9 F (37.2 C)  TempSrc: Oral  SpO2: 96%  Weight: 175 lb 8 oz (79.6 kg)  Height: 5' 1 (1.549 m)  Body mass index is 33.16 kg/m.  Medications Outpatient Encounter Medications as of 11/21/2023  Medication Sig   alendronate  (FOSAMAX ) 70 MG tablet Take 1 tablet (70 mg total) by mouth every 7 (seven) days. Take with a full glass of water on an empty stomach.   Calcium  Citrate-Vitamin D (CALCIUM  CITRATE + D PO) Take 2 tablets by mouth daily. 630 mg/ 500 units   Cholecalciferol (VITAMIN D3) 125 MCG (5000 UT) TABS Take 5,000 Units by mouth daily.    ciclopirox  (PENLAC ) 8 % solution Apply topically at bedtime.  Apply over nail and surrounding skin. Apply daily over previous coat. After seven (7) days, may remove with alcohol and continue cycle.   lidocaine -prilocaine  (EMLA ) cream Apply 1 application topically as needed.   metFORMIN  (GLUCOPHAGE -XR) 750 MG 24 hr tablet Take 1 tablet (750 mg total) by mouth daily with breakfast. Daily with largest meal   Multiple Vitamins-Minerals (WOMENS 50+ MULTI VITAMIN/MIN PO) Take 1 tablet by mouth daily.    Zinc 20 MG CAPS Take 20 mg by mouth daily.   [DISCONTINUED] atorvastatin  (LIPITOR) 10 MG tablet Take 1 tablet (10 mg total) by mouth daily.   [DISCONTINUED] DULoxetine  (CYMBALTA ) 30 MG capsule TAKE 1 CAPSULE BY MOUTH EVERY DAY   atorvastatin  (LIPITOR) 10 MG tablet Take 1 tablet (10 mg total) by mouth daily.   DULoxetine  (CYMBALTA ) 30 MG capsule Take 1 capsule (30 mg total) by mouth daily.   No facility-administered encounter medications on file as of 11/21/2023.     History: Past Medical History:  Diagnosis Date   Allergy    Arthritis    Bleeds easily    PT STATES IT TOOK HER A LITTLE LONGER TO STOP BLEEDING AFTER SINUS SURGERY   BRCA negative 11/2020   MyRisk neg except SDHA VUS   Cancer (HCC) 09/2010   Right breast cancer   Depression  Dysplastic nevus 08/25/2020   R mid sternum, mod to severe atypia   Dysplastic nevus 08/25/2020   L pretibia, mod to severe atypia   Dysplastic nevus 08/25/2020   L calf, mod to severe atypia   Dysrhythmia    H/O IRREGULAR HEART BEAT-ASYMPTOMATIC   Headache    MIGRAINES   Heart murmur    ASYMPTOMATIC   HLD (hyperlipidemia)    Osteoporosis    in spine   Sleep apnea    NO CPAP-UNABLE TO USE DUE TO SINUS ISSUES   Past Surgical History:  Procedure Laterality Date   BREAST LUMPECTOMY Right 2012   hx of breast ca rad only   BREAST SURGERY Right 09/2010   CESAREAN SECTION  08/21/92   COLONOSCOPY     COLONOSCOPY WITH PROPOFOL  N/A 07/28/2021   Procedure: COLONOSCOPY WITH PROPOFOL ;  Surgeon: Therisa Bi, MD;   Location: [facility] ENDOSCOPY;  Service: Gastroenterology;  Laterality: N/A;   DILATION AND CURETTAGE OF UTERUS N/A 01/13/2021   Procedure: DILATATION AND CURETTAGE;  Surgeon: Arloa Lamar SQUIBB, MD;  Location: [facility] ORS;  Service: Gynecology;  Laterality: N/A;   JOINT REPLACEMENT  09/24/17   RIGHT KNEE   KNEE ARTHROSCOPY Left 10/29/2019   Procedure: ARTHROSCOPY KNEE;  Surgeon: Mardee Lynwood SQUIBB, MD;  Location: [facility] ORS;  Service: Orthopedics;  Laterality: Left;   NASAL SINUS SURGERY  2008    Family History  Problem Relation Age of Onset   Breast cancer Mother 42       Breast and lung   Cancer Mother    Skin cancer Father        forehead   Arthritis Father    Hearing loss Father    Vision loss Father    Depression Sister    Cancer Paternal Uncle    Cancer Paternal Grandfather        bone   Arthritis Paternal Grandmother    Social History   Occupational History   Not on file  Tobacco Use   Smoking status: Never   Smokeless tobacco: Never  Vaping Use   Vaping status: Never Used  Substance and Sexual Activity   Alcohol use: Yes    Alcohol/week: 1.0 standard drink of alcohol    Types: 1 Glasses of wine per week    Comment: rare   Drug use: Never   Sexual activity: Yes    Comment: ha, ha, ha!    Tobacco Counseling Counseling given: N/A (does not use tobacco)   Immunizations and Health Maintenance Immunization History  Administered Date(s) Administered    Astrazeneca Covid-19 Vaccine, Pf, 0.5 Ml Non Us   11/24/2021   Fluad Quad(high Dose 65+) 12/18/2019   Influenza Inj Mdck Quad Pf 11/24/2021   Influenza, Seasonal, Injecte, Preservative Fre 01/15/2023   Influenza,inj,Quad PF,6+ Mos 10/23/2018, 01/26/2021   Moderna Covid-19 Fall Seasonal Vaccine 37yrs & older 01/15/2023   PFIZER Comirnaty(Gray Top)Covid-19 Tri-Sucrose Vaccine 07/21/2020   PFIZER(Purple Top)SARS-COV-2 Vaccination 05/05/2019, 05/26/2019, 11/28/2019   Td 08/14/2019   Zoster Recombinant(Shingrix) 01/26/2021,  03/30/2021   There are no preventive care reminders to display for this patient.   Activities of Daily Living    11/21/2023    8:38 AM  In your present state of health, do you have any difficulty performing the following activities:  Hearing? 0  Vision? 0  Difficulty concentrating or making decisions? 0  Walking or climbing stairs? 0  Dressing or bathing? 0  Doing errands, shopping? 0  Preparing Food and eating ? N  Using the  Toilet? N  In the past six months, have you accidently leaked urine? N  Do you have problems with loss of bowel control? N  Managing your Medications? N  Managing your Finances? N  Housekeeping or managing your Housekeeping? N    Physical Exam   Physical Exam Vitals and nursing note reviewed.  Constitutional:      General: She is awake.     Appearance: Normal appearance. She is obese.  HENT:     Head: Normocephalic and atraumatic.     Right Ear: Tympanic membrane, ear canal and external ear normal.     Left Ear: Tympanic membrane, ear canal and external ear normal.     Nose: Nose normal.     Mouth/Throat:     Mouth: Mucous membranes are moist.     Pharynx: Oropharynx is clear. No oropharyngeal exudate or posterior oropharyngeal erythema.  Eyes:     General: No scleral icterus.    Extraocular Movements: Extraocular movements intact.     Conjunctiva/sclera: Conjunctivae normal.     Pupils: Pupils are equal, round, and reactive to light.  Neck:     Thyroid: No thyromegaly or thyroid tenderness.  Cardiovascular:     Rate and Rhythm: Normal rate and regular rhythm.     Pulses: Normal pulses.     Heart sounds: Normal heart sounds.  Pulmonary:     Effort: Pulmonary effort is normal. No tachypnea, bradypnea or respiratory distress.     Breath sounds: Normal breath sounds. No stridor. No wheezing, rhonchi or rales.  Abdominal:     General: Bowel sounds are normal. There is no distension.     Palpations: Abdomen is soft. There is no mass.      Tenderness: There is no abdominal tenderness. There is no guarding.     Hernia: No hernia is present.  Musculoskeletal:     Cervical back: Normal range of motion and neck supple.     Right lower leg: No edema.     Left lower leg: No edema.  Lymphadenopathy:     Cervical: No cervical adenopathy.  Skin:    General: Skin is warm and dry.  Neurological:     Mental Status: She is alert and oriented to person, place, and time. Mental status is at baseline.  Psychiatric:        Mood and Affect: Mood normal.        Behavior: Behavior normal.     Advanced Directives: None    Assessment:    This is a routine wellness examination for this patient .   Vision/Hearing screen No results found.   Goals      Lose weight        Depression Screen    11/21/2023    9:02 AM 03/26/2023    9:56 AM 12/12/2022    1:47 PM 08/22/2022    1:08 PM  PHQ 2/9 Scores  PHQ - 2 Score 0 0 0 0  PHQ- 9 Score 1  0      Fall Risk    11/21/2023    9:02 AM  Fall Risk   Falls in the past year? 1  Number falls in past yr: 0  Injury with Fall? 1    Cognitive Function:        11/21/2023    8:38 AM  6CIT Screen  What Year? 0 points  What month? 0 points  What time? 0 points  Count back from 20 0 points  Months in reverse 0  points  Repeat phrase 0 points  Total Score 0 points    Patient Care Team: Bishoy Cupp N, DO as PCP - General (Family Medicine)     Plan:    Problem List Items Addressed This Visit     Hyperlipidemia   Relevant Medications   atorvastatin  (LIPITOR) 10 MG tablet   Other Relevant Orders   Comprehensive metabolic panel with GFR   Lipid panel   Prediabetes   Relevant Orders   Hemoglobin A1c   Onychomycosis   Relevant Medications   ciclopirox  (PENLAC ) 8 % solution   Other Visit Diagnoses       Welcome to Medicare preventive visit    -  Primary     Elevated ALT measurement       Relevant Orders   Comprehensive metabolic panel with GFR     Chronic kidney  disease, stage 2, mildly decreased GFR       Relevant Orders   Microalbumin / creatinine urine ratio   Comprehensive metabolic panel with GFR     Obesity (BMI 30.0-34.9)         Weight loss counseling, encounter for         Age-related osteoporosis without current pathological fracture         Chronic pain of right knee         Anxiety and depression       Relevant Medications   DULoxetine  (CYMBALTA ) 30 MG capsule       Welcome to Medicare preventative visit Routine screenings performed today.  No acute concerns.  Onychomycosis; elevated ALT measurement Present on the great toenails bilaterally.  Podiatry would not prescribe oral medication due to mildly elevated ALT.  Discussed that, since the disease is limited, topical treatment is appropriate.  Prescribed ciclopirox  as noted.  Chronic kidney disease, stage 2, mildly decreased GFR Discussed stability of diagnosis over the past 3 years and importance of continued monitoring.  Order uACR and CMP.  Obesity (BMI 30.0-34.9); encounter for weight loss counseling; chronic pain of right knee Discussed importance of continuing focus on daily caloric deficit with a goal of 1700 cal or less per day, as well as focusing on achieving a minimum of 150 minutes/week of moderate intensity exercise.  Due to patient's chronic pain, discussed that this can be low impact exercises and/or chair exercises if needed in order to achieve this goal.  Discussed medication weight loss options including bupropion/naltrexone, topiramate, Wegovy and Zepbound.  Patient will be investigating with her insurance and contact clinic with her preferred option.  Patient denies personal history of pancreatitis, personal or family history of multiple endocrine neoplasia type II and personal or family history of medullary thyroid cancer. - Patient to focus on achieving a minimum of 150 minutes/week of moderate intensity exercise and maintaining a caloric deficit diet of 1700 cal  or less daily - Plan to send prescription for patient's preferred medication of those we discussed when patient contacts clinic.  Age-related osteoporosis without current pathological fracture Chronic, improved.  2019 DEXA scan showed a T-score of -2.9 in the lumbar spine, for which she was started on alendronate  July 2020.  Most recent DEXA scan in 2024 showed osteopenia with a T-score of -2.1 in the lumbar spine (osteopenic level).  Discussed that stopping alendronate  at the end of this year is appropriate.  Benefit of alendronate  typically carries over an additional 5 years after stopping.  Can consider restarting if appropriate at that time in 2030.  Anxiety and depression  Chronic, stable.  Refill duloxetine  today.    I have personally reviewed and noted the following in the patient's chart:   Medical and social history Use of alcohol, tobacco or illicit drugs  Current medications and supplements including opioid prescriptions. Patient is not currently taking opioid prescriptions. Functional ability and status Nutritional status Physical activity Advanced directives List of other physicians Hospitalizations, surgeries, and ER visits in previous 12 months Vitals Screenings to include cognitive, depression, and falls Referrals and appointments  In addition, I have reviewed and discussed with patient certain preventive protocols, quality metrics, and best practice recommendations. A written personalized care plan for preventive services as well as general preventive health recommendations were provided to patient.    Plan to follow-up in 3 months to address weight, especially if restarted medication, or in 6 months for chronic follow-up.     Terra Aveni N Lenna Hagarty, DO 11/21/2023

## 2023-11-26 MED ORDER — LIDOCAINE-PRILOCAINE 2.5-2.5 % EX CREA
1.0000 | TOPICAL_CREAM | CUTANEOUS | 0 refills | Status: AC | PRN
Start: 2023-11-26 — End: ?

## 2023-12-04 LAB — LIPID PANEL
Chol/HDL Ratio: 2.9 ratio (ref 0.0–4.4)
Cholesterol, Total: 190 mg/dL (ref 100–199)
HDL: 65 mg/dL (ref >39)
LDL Chol Calc (NIH): 106 mg/dL — ABNORMAL HIGH (ref 0–99)
Triglycerides: 110 mg/dL (ref 0–149)
VLDL Cholesterol Cal: 19 mg/dL (ref 5–40)

## 2023-12-04 LAB — COMPREHENSIVE METABOLIC PANEL WITH GFR
ALT: 37 IU/L — ABNORMAL HIGH (ref 0–32)
AST: 30 IU/L (ref 0–40)
Albumin: 4.5 g/dL (ref 3.9–4.9)
Alkaline Phosphatase: 114 IU/L (ref 49–135)
BUN/Creatinine Ratio: 19 (ref 12–28)
BUN: 16 mg/dL (ref 8–27)
Bilirubin Total: 0.8 mg/dL (ref 0.0–1.2)
CO2: 24 mmol/L (ref 20–29)
Calcium: 9.3 mg/dL (ref 8.7–10.3)
Chloride: 103 mmol/L (ref 96–106)
Creatinine, Ser: 0.83 mg/dL (ref 0.57–1.00)
Globulin, Total: 2 g/dL (ref 1.5–4.5)
Glucose: 101 mg/dL — ABNORMAL HIGH (ref 70–99)
Potassium: 4 mmol/L (ref 3.5–5.2)
Sodium: 141 mmol/L (ref 134–144)
Total Protein: 6.5 g/dL (ref 6.0–8.5)
eGFR: 78 mL/min/1.73 (ref >59)

## 2023-12-04 LAB — MICROALBUMIN / CREATININE URINE RATIO
Creatinine, Urine: 23.3 mg/dL
Microalb/Creat Ratio: 20 mg/g{creat} (ref 0–29)
Microalbumin, Urine: 4.7 ug/mL

## 2023-12-04 LAB — HEMOGLOBIN A1C
Est. average glucose Bld gHb Est-mCnc: 128 mg/dL
Hgb A1c MFr Bld: 6.1 % — ABNORMAL HIGH (ref 4.8–5.6)

## 2023-12-07 ENCOUNTER — Ambulatory Visit: Payer: Self-pay | Admitting: Family Medicine

## 2023-12-11 ENCOUNTER — Encounter: Payer: Self-pay | Admitting: Family Medicine

## 2023-12-11 DIAGNOSIS — E7849 Other hyperlipidemia: Secondary | ICD-10-CM

## 2023-12-15 ENCOUNTER — Other Ambulatory Visit: Payer: Self-pay | Admitting: Physician Assistant

## 2023-12-17 NOTE — Telephone Encounter (Signed)
 LOV- 11/21/2023 NOV- None [facility]- 01/16/2023 Outpatient Medication Detail   Disp Refills Start End   alendronate  (FOSAMAX ) 70 MG tablet 4 tablet 11 01/16/2023 --   Sig - Route: Take 1 tablet (70 mg total) by mouth every 7 (seven) days. Take with a full glass of water on an empty stomach. - Oral   Sent to pharmacy as: alendronate  (FOSAMAX ) 70 MG tablet   E-Prescribing Status: Receipt confirmed by pharmacy (01/16/2023 12:49 PM EST)

## 2023-12-19 MED ORDER — ROSUVASTATIN CALCIUM 10 MG PO TABS
10.0000 mg | ORAL_TABLET | Freq: Every day | ORAL | 3 refills | Status: AC
Start: 2023-12-19 — End: ?

## 2023-12-26 ENCOUNTER — Ambulatory Visit: Admitting: Dermatology

## 2023-12-26 DIAGNOSIS — Z1283 Encounter for screening for malignant neoplasm of skin: Secondary | ICD-10-CM | POA: Diagnosis not present

## 2023-12-26 DIAGNOSIS — D1801 Hemangioma of skin and subcutaneous tissue: Secondary | ICD-10-CM | POA: Diagnosis not present

## 2023-12-26 DIAGNOSIS — L820 Inflamed seborrheic keratosis: Secondary | ICD-10-CM

## 2023-12-26 DIAGNOSIS — L821 Other seborrheic keratosis: Secondary | ICD-10-CM

## 2023-12-26 DIAGNOSIS — L814 Other melanin hyperpigmentation: Secondary | ICD-10-CM

## 2023-12-26 DIAGNOSIS — D229 Melanocytic nevi, unspecified: Secondary | ICD-10-CM

## 2023-12-26 NOTE — Patient Instructions (Addendum)

## 2023-12-26 NOTE — Progress Notes (Signed)
   Follow-Up Visit   Subjective  Cyanna Neace is a 63 y.o. female who presents for the following: Inflamed SK follow-up. Some areas are still rough feeling on the left mandible.   The patient presents for Upper Body Skin Exam (UBSE) for skin cancer screening and mole check.  The patient has spots, moles and lesions to be evaluated, some may be new or changing.   The following portions of the chart were reviewed this encounter and updated as appropriate: medications, allergies, medical history  Review of Systems:  No other skin or systemic complaints except as noted in HPI or Assessment and Plan.  Objective  Well appearing patient in no apparent distress; mood and affect are within normal limits.  A focused examination was performed of the following areas: Face, neck, chest, back, arms  Relevant physical exam findings are noted in the Assessment and Plan.  L neck/mandible x 4 Erythematous stuck-on, waxy papule  Assessment & Plan   UBSE skin cancer screening performed today.   SEBORRHEIC KERATOSIS - Stuck-on, waxy, tan-brown papules and/or plaques  - Benign-appearing - Discussed benign etiology and prognosis. - Observe - Call for any changes  HEMANGIOMA Exam: red papule(s) Discussed benign nature. Recommend observation. Call for changes.  LENTIGINES Exam: scattered tan macules Due to sun exposure Treatment Plan: Benign-appearing, observe. Recommend daily broad spectrum sunscreen SPF 30+ to sun-exposed areas, reapply every 2 hours as needed.  Call for any changes  MELANOCYTIC NEVI Exam: Tan-brown and/or pink-flesh-colored symmetric macules and papules  Treatment Plan: Benign appearing on exam today. Recommend observation. Call clinic for new or changing moles. Recommend daily use of broad spectrum spf 30+ sunscreen to sun-exposed areas.   INFLAMED SEBORRHEIC KERATOSIS L neck/mandible x 4 Symptomatic, irritating, patient would like treated. Destruction of lesion - L  neck/mandible x 4  Destruction method: cryotherapy   Informed consent: discussed and consent obtained   Lesion destroyed using liquid nitrogen: Yes   Region frozen until ice ball extended beyond lesion: Yes   Outcome: patient tolerated procedure well with no complications   Post-procedure details: wound care instructions given   Additional details:  Prior to procedure, discussed risks of blister formation, small wound, skin dyspigmentation, or rare scar following cryotherapy. Recommend Vaseline ointment to treated areas while healing.     Return as scheduled, for TBSE.  IAndrea Kerns, CMA, am acting as scribe for Rexene Rattler, MD .   Documentation: I have reviewed the above documentation for accuracy and completeness, and I agree with the above.  Rexene Rattler, MD

## 2024-01-11 ENCOUNTER — Telehealth: Payer: Self-pay

## 2024-01-15 NOTE — Telephone Encounter (Signed)
 Please disregard the previous message. All your chronic conditions were covered this year by your PCP.  Please plan to be seen as you were instructed at your last visit.  [facility]

## 2024-02-19 ENCOUNTER — Ambulatory Visit: Admitting: Podiatry

## 2024-10-14 ENCOUNTER — Encounter: Admitting: Dermatology
